# Patient Record
Sex: Female | Born: 1965 | Race: White | Hispanic: No | Marital: Married | Smoking: Never smoker
Health system: Southern US, Community
[De-identification: ages and names within clinical notes are randomized; demographics above are authoritative.]

## PROBLEM LIST (undated history)

## (undated) DIAGNOSIS — K219 Gastro-esophageal reflux disease without esophagitis: Secondary | ICD-10-CM

## (undated) DIAGNOSIS — E042 Nontoxic multinodular goiter: Secondary | ICD-10-CM

## (undated) DIAGNOSIS — T7840XA Allergy, unspecified, initial encounter: Secondary | ICD-10-CM

## (undated) DIAGNOSIS — I10 Essential (primary) hypertension: Secondary | ICD-10-CM

## (undated) DIAGNOSIS — N289 Disorder of kidney and ureter, unspecified: Secondary | ICD-10-CM

## (undated) DIAGNOSIS — M19049 Primary osteoarthritis, unspecified hand: Secondary | ICD-10-CM

## (undated) DIAGNOSIS — D6851 Activated protein C resistance: Secondary | ICD-10-CM

## (undated) HISTORY — PX: ARTHROSCOPIC HAGLUNDS REPAIR: SHX5187

## (undated) HISTORY — DX: Primary osteoarthritis, unspecified hand: M19.049

## (undated) HISTORY — DX: Gastro-esophageal reflux disease without esophagitis: K21.9

## (undated) HISTORY — DX: Activated protein C resistance: D68.51

## (undated) HISTORY — DX: Essential (primary) hypertension: I10

## (undated) HISTORY — DX: Allergy, unspecified, initial encounter: T78.40XA

## (undated) HISTORY — DX: Nontoxic multinodular goiter: E04.2

## (undated) HISTORY — PX: FOOT SURGERY: SHX648

---
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
## 2015-01-28 LAB — HM COLONOSCOPY

## 2016-05-10 DIAGNOSIS — I10 Essential (primary) hypertension: Secondary | ICD-10-CM | POA: Insufficient documentation

## 2016-05-10 DIAGNOSIS — H5213 Myopia, bilateral: Secondary | ICD-10-CM | POA: Insufficient documentation

## 2016-05-10 DIAGNOSIS — M1990 Unspecified osteoarthritis, unspecified site: Secondary | ICD-10-CM | POA: Insufficient documentation

## 2017-10-17 ENCOUNTER — Encounter (INDEPENDENT_AMBULATORY_CARE_PROVIDER_SITE_OTHER): Payer: Self-pay

## 2017-10-17 ENCOUNTER — Encounter: Payer: Self-pay | Admitting: Physician Assistant

## 2017-10-17 ENCOUNTER — Ambulatory Visit: Payer: 59 | Admitting: Physician Assistant

## 2017-10-17 VITALS — BP 119/83 | HR 96 | Ht 67.0 in | Wt 251.0 lb

## 2017-10-17 DIAGNOSIS — E042 Nontoxic multinodular goiter: Secondary | ICD-10-CM | POA: Diagnosis not present

## 2017-10-17 DIAGNOSIS — Z130 Encounter for screening for diseases of the blood and blood-forming organs and certain disorders involving the immune mechanism: Secondary | ICD-10-CM

## 2017-10-17 DIAGNOSIS — Z1231 Encounter for screening mammogram for malignant neoplasm of breast: Secondary | ICD-10-CM

## 2017-10-17 DIAGNOSIS — Z832 Family history of diseases of the blood and blood-forming organs and certain disorders involving the immune mechanism: Secondary | ICD-10-CM | POA: Diagnosis not present

## 2017-10-17 DIAGNOSIS — M19042 Primary osteoarthritis, left hand: Secondary | ICD-10-CM | POA: Diagnosis not present

## 2017-10-17 DIAGNOSIS — E041 Nontoxic single thyroid nodule: Secondary | ICD-10-CM | POA: Insufficient documentation

## 2017-10-17 DIAGNOSIS — Z131 Encounter for screening for diabetes mellitus: Secondary | ICD-10-CM | POA: Diagnosis not present

## 2017-10-17 DIAGNOSIS — E6609 Other obesity due to excess calories: Secondary | ICD-10-CM

## 2017-10-17 DIAGNOSIS — Z808 Family history of malignant neoplasm of other organs or systems: Secondary | ICD-10-CM | POA: Diagnosis not present

## 2017-10-17 DIAGNOSIS — M19049 Primary osteoarthritis, unspecified hand: Secondary | ICD-10-CM | POA: Insufficient documentation

## 2017-10-17 DIAGNOSIS — Z13220 Encounter for screening for lipoid disorders: Secondary | ICD-10-CM | POA: Diagnosis not present

## 2017-10-17 DIAGNOSIS — M19041 Primary osteoarthritis, right hand: Secondary | ICD-10-CM

## 2017-10-17 DIAGNOSIS — Z7689 Persons encountering health services in other specified circumstances: Secondary | ICD-10-CM

## 2017-10-17 MED ORDER — OLMESARTAN MEDOXOMIL 40 MG PO TABS: 40.0000 mg | ORAL_TABLET | Freq: Every day | ORAL | 1 refills | Status: DC

## 2017-10-17 NOTE — Progress Notes (Signed)
HPI:                                                                Areal Cochrane is a 52 y.o. female who presents to [name]: Primary Care Sports Medicine today to establish care  Current concerns:   HTN: taking Benicar 40 mg daily. Compliant with medications. Denies vision change, headache, chest pain with exertion, orthopnea, lightheadedness, syncope and edema. Risk factors include: obesity, family hx  Thyroid nodules: per patient, 3 nodules that are followed by [facility] with yearly ultrasound.   Depression screen [facility] 2/9 10/17/2017  Decreased Interest 0  Down, Depressed, Hopeless 0  PHQ - 2 Score 0    No flowsheet data found.    Past Medical History:  Diagnosis Date  . Heterozygous factor V Leiden mutation (HCC) 10/21/2017  . Hypertension   . Multiple thyroid nodules   . Osteoarthritis of hand    Past Surgical History:  Procedure Laterality Date  . ARTHROSCOPIC HAGLUNDS REPAIR Right   . FOOT SURGERY Right    Social History   Tobacco Use  . Smoking status: Never Smoker  . Smokeless tobacco: Never Used  Substance Use Topics  . Alcohol use: Yes   family history includes Cancer in her brother and mother; Diabetes in her father and sister; Factor V Leiden deficiency in her father and sister; Heart attack in her father and mother; Hyperlipidemia in her sister; Hypertension in her brother, brother, father, sister, and sister; Thyroid cancer in her brother.    ROS: Review of Systems  Endo/Heme/Allergies:       + vasomotor flushing  All other systems reviewed and are negative.    Medications: Current Outpatient Medications  Medication Sig Dispense Refill  . Cholecalciferol (VITAMIN D PO) Take by mouth.    . cyclobenzaprine (FLEXERIL) 5 MG tablet Take 5 mg by mouth as needed for muscle spasms.    Marland Kitchen olmesartan (BENICAR) 40 MG tablet Take 1 tablet (40 mg total) by mouth daily. 90 tablet 1   No current facility-administered medications for this  visit.    Allergies  Allergen Reactions  . Codeine Shortness Of Breath       Objective:  BP 119/83   Pulse 96   Ht  (1.702 m)   Wt 251 lb (113.9 kg)   LMP 10/13/2017   BMI 39.31 kg/m  Gen:  alert, not ill-appearing, no distress, appropriate for age, obese female HEENT: head normocephalic without obvious abnormality, conjunctiva and cornea clear, wearing glasses, trachea midline, thyroid enlarged, no tenderness Pulm: Normal work of breathing, normal phonation, clear to auscultation bilaterally, no wheezes, rales or rhonchi CV: Normal rate, regular rhythm, s1 and s2 distinct, no murmurs, clicks or rubs, no carotid bruit, radial pulses 2+ symmetric Neuro: alert and oriented x 3, no tremor MSK: extremities atraumatic, normal gait and station Skin: intact, no rashes on exposed skin, no jaundice, no cyanosis Psych: well-groomed, cooperative, good eye contact, euthymic mood, affect mood-congruent, speech is articulate, and thought processes clear and goal-directed    No results found for this or any previous visit (from the past 72 hour(s)). Mm 3d Screen Breast Bilateral  Result Date: 10/25/2017 CLINICAL DATA:  Screening. EXAM: DIGITAL SCREENING BILATERAL MAMMOGRAM WITH TOMO AND CAD COMPARISON:  None. ACR Breast Density Category b: There are scattered areas of fibroglandular density. FINDINGS: There are no findings suspicious for malignancy. Images were processed with CAD. IMPRESSION: No mammographic evidence of malignancy. A result letter of this screening mammogram will be mailed directly to the patient. RECOMMENDATION: Screening mammogram in one year. (Code:[id number]-01Y) BI-RADS CATEGORY  1: Negative. Electronically Signed   By: Amie Portland M.D.   On: 10/25/2017 12:42      Assessment and Plan: 52 y.o. female with   Encounter to establish care  Breast cancer screening by mammogram - Plan: MM 3D SCREEN BREAST BILATERAL  Family history of thyroid cancer - Plan: TSH + free  T4  Screening for lipid disorders - Plan: Lipid Panel w/reflex Direct LDL  Screening for blood disease - Plan: CBC, Comprehensive metabolic panel  Screening for diabetes mellitus - Plan: Comprehensive metabolic panel  Family history of factor V Leiden mutation - Plan: Factor 5 leiden, Factor 5 assay  Primary osteoarthritis of both hands  Multiple thyroid nodules  - Personally reviewed PMH, PSH, PFH, medications, allergies, HM - Age-appropriate cancer screening: mammogram ordered, due for Pap smear - Influenza n/a - Tdap UTD per patient - PHQ2 negative  HTN BP Readings from Last 3 Encounters:  10/17/17 119/83  - BP in range - checking renal function and electrolytes today - cont Benicar 40 mg daily - baby asa for primary prevention - counseled on therapeutic lifestyle changes  Obesity, Class 1 - counseled on weight loss through decreased caloric intake and increased aerobic exercise - DASH eating plan  Patient education and anticipatory guidance given Patient agrees with treatment plan Follow-up in 1 month for CPE w/Pap or sooner as needed if symptoms worsen or fail to improve  Levonne Hubert PA-C

## 2017-10-17 NOTE — Patient Instructions (Addendum)
For your blood pressure: - Goal <130/80 - baby aspirin 81 mg daily to help prevent heart attack/stroke - monitor and log blood pressures at home - check around the same time each day in a relaxed setting - Limit salt to <2000 mg/day - Follow DASH eating plan - limit alcohol to 2 standard drinks per day for men and 1 per day for women - avoid tobacco products - weight loss: 7% of current body weight - follow-up every 6 months for your blood pressure   

## 2017-10-18 LAB — COMPREHENSIVE METABOLIC PANEL
AG Ratio: 1.5 (calc) (ref 1.0–2.5)
ALBUMIN MSPROF: 4.4 g/dL (ref 3.6–5.1)
ALT: 16 U/L (ref 6–29)
AST: 15 U/L (ref 10–35)
Alkaline phosphatase (APISO): 49 U/L (ref 33–130)
BUN: 14 mg/dL (ref 7–25)
CHLORIDE: 104 mmol/L (ref 98–110)
CO2: 24 mmol/L (ref 20–32)
CREATININE: 0.9 mg/dL (ref 0.50–1.05)
Calcium: 9.8 mg/dL (ref 8.6–10.4)
GLOBULIN: 2.9 g/dL (ref 1.9–3.7)
GLUCOSE: 97 mg/dL (ref 65–99)
POTASSIUM: 4.3 mmol/L (ref 3.5–5.3)
Sodium: 137 mmol/L (ref 135–146)
TOTAL PROTEIN: 7.3 g/dL (ref 6.1–8.1)
Total Bilirubin: 0.9 mg/dL (ref 0.2–1.2)

## 2017-10-18 LAB — LIPID PANEL W/REFLEX DIRECT LDL
Cholesterol: 192 mg/dL (ref <200)
HDL: 48 mg/dL — ABNORMAL LOW (ref >50)
LDL CHOLESTEROL (CALC): 122 mg/dL — AB
NON-HDL CHOLESTEROL (CALC): 144 mg/dL — AB (ref <130)
Total CHOL/HDL Ratio: 4 (calc) (ref <5.0)
Triglycerides: 108 mg/dL (ref <150)

## 2017-10-18 LAB — CBC
HCT: 43.5 % (ref 35.0–45.0)
Hemoglobin: 15.1 g/dL (ref 11.7–15.5)
MCH: 31.9 pg (ref 27.0–33.0)
MCHC: 34.7 g/dL (ref 32.0–36.0)
MCV: 92 fL (ref 80.0–100.0)
MPV: 9.9 fL (ref 7.5–12.5)
Platelets: 323 10*3/uL (ref 140–400)
RBC: 4.73 10*6/uL (ref 3.80–5.10)
RDW: 12.1 % (ref 11.0–15.0)
WBC: 8.3 10*3/uL (ref 3.8–10.8)

## 2017-10-18 LAB — TSH+FREE T4: TSH W/REFLEX TO FT4: 0.59 mIU/L

## 2017-10-20 LAB — FACTOR 5 LEIDEN

## 2017-10-20 LAB — FACTOR 5 ASSAY: FACTOR V ACTIVITY: 99 % (ref 65–150)

## 2017-10-21 ENCOUNTER — Encounter: Payer: Self-pay | Admitting: Physician Assistant

## 2017-10-21 DIAGNOSIS — D6851 Activated protein C resistance: Secondary | ICD-10-CM | POA: Insufficient documentation

## 2017-10-21 HISTORY — DX: Activated protein C resistance: D68.51

## 2017-10-21 NOTE — Progress Notes (Signed)
Good morning Desiree Valencia, Accardi do carry the factor V Leiden variant and this increases your overall risk of a blood clot. However, you only have 1 copy of the gene mutation (heterozygous), so you are at decreased risk compared to someone who has both copies (homozygous).  Your other labs look great! - normal kidney function - cholesterol in a healthy range - normal blood counts - no evidence of diabetes - no evidence of hypo- or hyper-thyroidism  Best, Vinetta Bergamo

## 2017-10-25 ENCOUNTER — Ambulatory Visit (INDEPENDENT_AMBULATORY_CARE_PROVIDER_SITE_OTHER): Payer: 59

## 2017-10-25 DIAGNOSIS — Z1231 Encounter for screening mammogram for malignant neoplasm of breast: Secondary | ICD-10-CM | POA: Diagnosis not present

## 2017-10-28 ENCOUNTER — Encounter: Payer: Self-pay | Admitting: Physician Assistant

## 2017-10-28 DIAGNOSIS — E66812 Obesity, class 2: Secondary | ICD-10-CM | POA: Insufficient documentation

## 2017-10-28 DIAGNOSIS — E6609 Other obesity due to excess calories: Secondary | ICD-10-CM | POA: Insufficient documentation

## 2017-11-18 ENCOUNTER — Other Ambulatory Visit (HOSPITAL_COMMUNITY)
Admission: RE | Admit: 2017-11-18 | Discharge: 2017-11-18 | Disposition: A | Discharge status: Home / Self Care | Payer: 59 | Source: Ambulatory Visit | Attending: Physician Assistant | Admitting: Physician Assistant

## 2017-11-18 ENCOUNTER — Encounter: Payer: Self-pay | Admitting: Physician Assistant

## 2017-11-18 ENCOUNTER — Ambulatory Visit (INDEPENDENT_AMBULATORY_CARE_PROVIDER_SITE_OTHER): Payer: 59 | Admitting: Physician Assistant

## 2017-11-18 VITALS — BP 153/89 | HR 100 | Wt 250.0 lb

## 2017-11-18 DIAGNOSIS — M19042 Primary osteoarthritis, left hand: Secondary | ICD-10-CM | POA: Diagnosis not present

## 2017-11-18 DIAGNOSIS — L304 Erythema intertrigo: Secondary | ICD-10-CM | POA: Diagnosis not present

## 2017-11-18 DIAGNOSIS — Z124 Encounter for screening for malignant neoplasm of cervix: Secondary | ICD-10-CM

## 2017-11-18 DIAGNOSIS — E6609 Other obesity due to excess calories: Secondary | ICD-10-CM | POA: Diagnosis not present

## 2017-11-18 DIAGNOSIS — R030 Elevated blood-pressure reading, without diagnosis of hypertension: Secondary | ICD-10-CM

## 2017-11-18 DIAGNOSIS — M19041 Primary osteoarthritis, right hand: Secondary | ICD-10-CM | POA: Diagnosis not present

## 2017-11-18 MED ORDER — MELOXICAM 15 MG PO TABS: 15.0000 mg | ORAL_TABLET | Freq: Every day | ORAL | 3 refills | Status: DC | PRN

## 2017-11-18 MED ORDER — NYSTATIN 100000 UNIT/GM EX CREA: 1.0000 "application " | TOPICAL_CREAM | Freq: Two times a day (BID) | CUTANEOUS | 0 refills | Status: DC

## 2017-11-18 NOTE — Progress Notes (Signed)
HPI:                                                                Desiree Valencia is a 52 y.o. female who [facility] Desiree Valencia: Primary Care Sports Medicine today for Pap smear only  Current Concerns include  Requesting refill of Meloxicam for hand OA   GYN/Sexual Health  Obstetrics: G0P0  Menstrual status: having periods, perimenopausal  LMP: 10/23/17  Menses: shorter, irregular  Last pap smear: unknown  History of abnormal pap smears: no  Sexually active: yes  Current contraception:  History of STI: no  Depression screen PHQ 2/9 10/17/2017  Decreased Interest 0  Down, Depressed, Hopeless 0  PHQ - 2 Score 0    Health Maintenance Health Maintenance  Topic Date Due  . HIV Screening  08/13/1979  . PAP SMEAR  08/13/1994  . INFLUENZA VACCINE  01/02/2018  . MAMMOGRAM  10/26/2019  . COLONOSCOPY  01/28/2020  . TETANUS/TDAP  10/17/2021    Past Medical History:  Diagnosis Date  . Heterozygous factor V Leiden mutation (HCC) 10/21/2017  . Hypertension   . Multiple thyroid nodules   . Osteoarthritis of hand    Past Surgical History:  Procedure Laterality Date  . ARTHROSCOPIC HAGLUNDS REPAIR Right   . FOOT SURGERY Right    Social History   Tobacco Use  . Smoking status: Never Smoker  . Smokeless tobacco: Never Used  Substance Use Topics  . Alcohol use: Yes   family history includes Cancer in her brother and mother; Diabetes in her father and sister; Factor V Leiden deficiency in her father and sister; Heart attack in her father and mother; Hyperlipidemia in her sister; Hypertension in her brother, brother, father, sister, and sister; Thyroid cancer in her brother.  ROS: negative except as noted in the HPI  Medications: Current Outpatient Medications  Medication Sig Dispense Refill  . Cholecalciferol (VITAMIN D PO) Take by mouth.    . cyclobenzaprine (FLEXERIL) 5 MG tablet Take 5 mg by mouth as needed for muscle spasms.    Marland Kitchen.  olmesartan (BENICAR) 40 MG tablet Take 1 tablet (40 mg total) by mouth daily. 90 tablet 1  . omeprazole (PRILOSEC) 20 MG capsule Take 20 mg by mouth daily.    . meloxicam (MOBIC) 15 MG tablet Take 1 tablet (15 mg total) by mouth daily as needed for pain. 30 tablet 3  . nystatin cream (MYCOSTATIN) Apply 1 application topically 2 (two) times daily. 30 g 0   No current facility-administered medications for this visit.    Allergies  Allergen Reactions  . Codeine Shortness Of Breath       Objective:  BP (!) 153/89   Pulse 100   Wt 250 lb (113.4 kg)   LMP 10/23/2017 (Exact Date)   BMI 39.16 kg/m  Gen:  alert, not ill-appearing, no distress, appropriate for age, obese female HEENT: head normocephalic without obvious abnormality, conjunctiva and cornea clear, wearing glasses, trachea midline Pulm: Normal work of breathing, normal phonation, clear to auscultation bilaterally, no wheezes, rales or rhonchi CV: Normal rate, regular rhythm, s1 and s2 distinct, no murmurs, clicks or rubs  GU: inguinal folds with hyperpigmented, scaly plaques, vulva without rashes or lesions, normal introitus and urethral meatus, vaginal mucosa without  erythema, normal discharge, cervix non-friable without lesions Neuro: alert and oriented x 3, no tremor MSK: extremities atraumatic, normal gait and station Skin: intact, no rashes on exposed skin, no jaundice, no cyanosis Psych: well-groomed, cooperative, good eye contact, euthymic mood, affect mood-congruent, speech is articulate, and thought processes clear and goal-directed    A chaperone was used for the GU portion of the exam, Desiree Valencia, CMA.    No results found for this or any previous visit (from the past 72 hour(s)). No results found.    Assessment and Plan: 52 y.o. female with   Encounter for Pap smear of cervix with HPV DNA cotesting - Plan: Cytology - PAP  Intertrigo - Plan: nystatin cream (MYCOSTATIN)  Primary osteoarthritis of both  hands - Plan: meloxicam (MOBIC) 15 MG tablet  Class 2 obesity due to excess calories without serious comorbidity in adult, unspecified BMI  Elevated blood pressure reading  - Personally reviewed PMH, PSH, PFH, medications, allergies, HM - Age-appropriate cancer screening: Mammogram UTD, Pap collected today, Colonoscopy UTD - Influenza n/a - Tdap UTD per patient - VIS given for Shingrix, still on backorder - PHQ2 negative - Fasting labs performed 4 weeks ago and unremarkable, low HDL   Elevated BP BP Readings from Last 3 Encounters:  11/18/17 (!) 153/89  10/17/17 119/83  - BP out of range on 2 checks, patient reports she is nervous and has white coat syndrome - counseled on therapeutic lifestyle changes - patient to monitor and log BP's at home - follow-up nurse BP check in 4 weeks  Obesity, Class 2 - counseled on weight loss through decreased caloric intake and increased aerobic exercise  Patient education and anticipatory guidance given Patient agrees with treatment plan Follow-up based on Pap results or sooner as needed  Levonne Hubert PA-C

## 2017-11-18 NOTE — Patient Instructions (Signed)
Preventive Care 40-64 Years, Female Preventive care refers to lifestyle choices and visits with your health care provider that can promote health and wellness. What does preventive care include?  A yearly physical exam. This is also called an annual well check.  Dental exams once or twice a year.  Routine eye exams. Ask your health care provider how often you should have your eyes checked.  Personal lifestyle choices, including: ? Daily care of your teeth and gums. ? Regular physical activity. ? Eating a healthy diet. ? Avoiding tobacco and drug use. ? Limiting alcohol use. ? Practicing safe sex. ? Taking low-dose aspirin daily starting at age 58. ? Taking vitamin and mineral supplements as recommended by your health care provider. What happens during an annual well check? The services and screenings done by your health care provider during your annual well check will depend on your age, overall health, lifestyle risk factors, and family history of disease. Counseling Your health care provider may ask you questions about your:  Alcohol use.  Tobacco use.  Drug use.  Emotional well-being.  Home and relationship well-being.  Sexual activity.  Eating habits.  Work and work Statistician.  Method of birth control.  Menstrual cycle.  Pregnancy history.  Screening You may have the following tests or measurements:  Height, weight, and BMI.  Blood pressure.  Lipid and cholesterol levels. These may be checked every 5 years, or more frequently if you are over 81 years old.  Skin check.  Lung cancer screening. You may have this screening every year starting at age 78 if you have a 30-pack-year history of smoking and currently smoke or have quit within the past 15 years.  Fecal occult blood test (FOBT) of the stool. You may have this test every year starting at age 65.  Flexible sigmoidoscopy or colonoscopy. You may have a sigmoidoscopy every 5 years or a colonoscopy  every 10 years starting at age 30.  Hepatitis C blood test.  Hepatitis B blood test.  Sexually transmitted disease (STD) testing.  Diabetes screening. This is done by checking your blood sugar (glucose) after you have not eaten for a while (fasting). You may have this done every 1-3 years.  Mammogram. This may be done every 1-2 years. Talk to your health care provider about when you should start having regular mammograms. This may depend on whether you have a family history of breast cancer.  BRCA-related cancer screening. This may be done if you have a family history of breast, ovarian, tubal, or peritoneal cancers.  Pelvic exam and Pap test. This may be done every 3 years starting at age 80. Starting at age 36, this may be done every 5 years if you have a Pap test in combination with an HPV test.  Bone density scan. This is done to screen for osteoporosis. You may have this scan if you are at high risk for osteoporosis.  Discuss your test results, treatment options, and if necessary, the need for more tests with your health care provider. Vaccines Your health care provider may recommend certain vaccines, such as:  Influenza vaccine. This is recommended every year.  Tetanus, diphtheria, and acellular pertussis (Tdap, Td) vaccine. You may need a Td booster every 10 years.  Varicella vaccine. You may need this if you have not been vaccinated.  Zoster vaccine. You may need this after age 5.  Measles, mumps, and rubella (MMR) vaccine. You may need at least one dose of MMR if you were born in  1958 or later. You may also need a second dose.  Pneumococcal 13-valent conjugate (PCV13) vaccine. You may need this if you have certain conditions and were not previously vaccinated.  Pneumococcal polysaccharide (PPSV23) vaccine. You may need one or two doses if you smoke cigarettes or if you have certain conditions.  Meningococcal vaccine. You may need this if you have certain  conditions.  Hepatitis A vaccine. You may need this if you have certain conditions or if you travel or work in places where you may be exposed to hepatitis A.  Hepatitis B vaccine. You may need this if you have certain conditions or if you travel or work in places where you may be exposed to hepatitis B.  Haemophilus influenzae type b (Hib) vaccine. You may need this if you have certain conditions.  Talk to your health care provider about which screenings and vaccines you need and how often you need them. This information is not intended to replace advice given to you by your health care provider. Make sure you discuss any questions you have with your health care provider. Document Released: 06/17/2015 Document Revised: 02/08/2016 Document Reviewed: 03/22/2015 Elsevier Interactive Patient Education  2018 [place] is skin irritation or inflammation (dermatitis) that occurs when folds of skin rub together. The irritation can cause a rash and make skin raw and itchy. This condition most commonly occurs in the skin folds of these areas:  Toes.  Armpits.  Groin.  Belly.  Breasts.  Buttocks.  Intertrigo is not passed from person to person (is not contagious). What are the causes? This condition is caused by heat, moisture, friction, and lack of air circulation. The condition can be made worse by:  Sweat.  Bacteria or a fungus, such as yeast.  What increases the risk? This condition is more likely to occur if you have moisture in your skin folds. It is also more likely to develop in people who:  Have diabetes.  Are overweight.  Are on bed rest.  Live in a warm and moist climate.  Wear splints, braces, or other medical devices.  Are not able to control their bowels or bladder (have incontinence).  What are the signs or symptoms? Symptoms of this condition include:  A pink or red skin rash.  Brown patches on the skin.  Raw or scaly  skin.  Itchiness.  A burning feeling.  Bleeding.  Leaking fluid.  A bad smell.  How is this diagnosed? This condition is diagnosed with a medical history and physical exam. You may also have a skin swab to test for bacteria or a fungus, such as yeast. How is this treated? Treatment may include:  Cleaning and drying your skin.  An oral antibiotic medicine or antibiotic skin cream for a bacterial infection.  Antifungal cream or pills for an infection that was caused by a fungus, such as yeast.  Steroid ointment to relieve itchiness and irritation.  Follow these instructions at home:  Keep the affected area clean and dry.  Do not scratch your skin.  Stay in a cool environment as much as possible. Use an air conditioner or fan, if available.  Apply over-the-counter and prescription medicines only as told by your health care provider.  If you were prescribed an antibiotic medicine, use it as told by your health care provider. Do not stop using the antibiotic even if your condition improves.  Keep all follow-up visits as told by your health care provider. This is important. How  is this prevented?  Maintain a healthy weight.  Take care of your feet, especially if you have diabetes. Foot care includes: ? Wearing shoes that fit well. ? Keeping your feet dry. ? Wearing clean, breathable socks.  Protect the skin around your groin and buttocks, especially if you have incontinence. Skin protection includes: ? Following a regular cleaning routine. ? Using moisturizers and skin protectants. ? Changing protection pads frequently.  Do not wear tight clothes. Wear clothes that are loose and absorbent. Wear clothes that are made of cotton.  Wear a bra that gives good support, if needed.  Shower and dry yourself thoroughly after activity. Use a hair dryer on a cool setting to dry between skin folds, especially after you bathe.  If you have diabetes, keep your blood sugar under  control. Contact a health care provider if:  Your symptoms do not improve with treatment.  Your symptoms get worse or they spread.  You notice increased redness and warmth.  You have a fever. This information is not intended to replace advice given to you by your health care provider. Make sure you discuss any questions you have with your health care provider. Document Released: 05/21/2005 Document Revised: 10/27/2015 Document Reviewed: 11/22/2014 Elsevier Interactive Patient Education  2018 [employer].

## 2017-11-19 ENCOUNTER — Encounter: Payer: Self-pay | Admitting: Physician Assistant

## 2017-11-19 LAB — CYTOLOGY - PAP
Adequacy: ABSENT
Diagnosis: NEGATIVE
HPV (WINDOPATH): NOT DETECTED

## 2017-11-19 NOTE — Progress Notes (Signed)
Good morning Kitty,  Your Pap smear was normal and HPV testing was negative. Recommend repeat screening in 5 years.  Best, Vinetta Bergamoharley

## 2017-12-15 ENCOUNTER — Encounter: Payer: Self-pay | Admitting: Physician Assistant

## 2017-12-19 ENCOUNTER — Ambulatory Visit (INDEPENDENT_AMBULATORY_CARE_PROVIDER_SITE_OTHER): Payer: 59 | Admitting: Physician Assistant

## 2017-12-19 VITALS — BP 139/72 | HR 101 | Wt 251.0 lb

## 2017-12-19 DIAGNOSIS — I10 Essential (primary) hypertension: Secondary | ICD-10-CM | POA: Diagnosis not present

## 2017-12-19 NOTE — Progress Notes (Signed)
   Subjective:    Patient ID: Desiree Valencia, female    DOB: 10/16/1965, 52 y.o.   MRN: [id number]  HPI  Desiree Valencia is here for a blood pressure check. Currently taking Olmesartan 40 mg daily. Compliant with medications. Denies chest pains, shortness of breath or dizziness.  Home BP range 111-127/67-80  Review of Systems     Objective:   Physical Exam  Vitals:   12/19/17 0841  BP: 139/72  Pulse: (!) 101  SpO2: 100%         Assessment & Plan:  Hypertension - SBP out of range in office, but home readings are in range. Patient advised to follow up as directed and continue medications as directed.

## 2018-04-13 ENCOUNTER — Other Ambulatory Visit: Payer: Self-pay | Admitting: Physician Assistant

## 2018-04-20 ENCOUNTER — Encounter: Payer: Self-pay | Admitting: Physician Assistant

## 2018-06-06 ENCOUNTER — Encounter: Payer: Self-pay | Admitting: Physician Assistant

## 2018-06-06 NOTE — Telephone Encounter (Signed)
Patient advised.

## 2018-06-09 ENCOUNTER — Ambulatory Visit: Payer: 59 | Admitting: Physician Assistant

## 2018-06-09 ENCOUNTER — Ambulatory Visit (INDEPENDENT_AMBULATORY_CARE_PROVIDER_SITE_OTHER): Payer: 59

## 2018-06-09 ENCOUNTER — Encounter: Payer: Self-pay | Admitting: Physician Assistant

## 2018-06-09 VITALS — BP 133/82 | HR 103 | Wt 255.0 lb

## 2018-06-09 DIAGNOSIS — R000 Tachycardia, unspecified: Secondary | ICD-10-CM

## 2018-06-09 DIAGNOSIS — M25531 Pain in right wrist: Secondary | ICD-10-CM | POA: Diagnosis not present

## 2018-06-09 DIAGNOSIS — M79642 Pain in left hand: Secondary | ICD-10-CM

## 2018-06-09 DIAGNOSIS — M79641 Pain in right hand: Secondary | ICD-10-CM | POA: Diagnosis not present

## 2018-06-09 DIAGNOSIS — M67431 Ganglion, right wrist: Secondary | ICD-10-CM | POA: Diagnosis not present

## 2018-06-09 MED ORDER — ACETAMINOPHEN ER 650 MG PO TBCR: 650.0000 mg | EXTENDED_RELEASE_TABLET | Freq: Three times a day (TID) | ORAL | 3 refills | Status: AC | PRN

## 2018-06-09 NOTE — Progress Notes (Signed)
HPI:                                                                Desiree Valencia is a 53 y.o. female who presents to [facility] Desiree Valencia: Primary Care Sports Medicine today for bilateral hand pain  For the last two weeks she has had generalized joint pain described as "achey" in both of her hands. She also noted a right wrist lump and left palm lump developed approx 2 weeks ago.  No history of trauma/injury. Reports swelling and pain has improved with splinting and Meloxicam.    Past Medical History:  Diagnosis Date  . Heterozygous factor V Leiden mutation (HCC) 10/21/2017  . Hypertension   . Multiple thyroid nodules   . Osteoarthritis of hand    Past Surgical History:  Procedure Laterality Date  . ARTHROSCOPIC HAGLUNDS REPAIR Right   . FOOT SURGERY Right    Social History   Tobacco Use  . Smoking status: Never Smoker  . Smokeless tobacco: Never Used  Substance Use Topics  . Alcohol use: Yes   family history includes Cancer in her brother and mother; Diabetes in her father and sister; Factor V Leiden deficiency in her father and sister; Heart attack in her father and mother; Hyperlipidemia in her sister; Hypertension in her brother, brother, father, sister, and sister; Thyroid cancer in her brother.    ROS: negative except as noted in the HPI  Medications: Current Outpatient Medications  Medication Sig Dispense Refill  . acetaminophen (TYLENOL) 650 MG CR tablet Take 1-2 tablets (650-1,300 mg total) by mouth every 8 (eight) hours as needed for pain. 90 tablet 3  . Cholecalciferol (VITAMIN D PO) Take by mouth.    . cyclobenzaprine (FLEXERIL) 5 MG tablet Take 5 mg by mouth as needed for muscle spasms.    . meloxicam (MOBIC) 15 MG tablet Take 1 tablet (15 mg total) by mouth daily as needed for pain. 30 tablet 3  . olmesartan (BENICAR) 40 MG tablet Take 1 tablet (40 mg total) by mouth daily. 90 tablet 1  . omeprazole (PRILOSEC) 20 MG capsule Take 20 mg by mouth  daily.     No current facility-administered medications for this visit.    Allergies  Allergen Reactions  . Codeine Shortness Of Breath       Objective:  BP 133/82   Pulse (!) 103   Wt 255 lb (115.7 kg)   SpO2 97%   BMI 39.94 kg/m  Gen:  alert, not ill-appearing, no distress, appropriate for age HEENT: head normocephalic without obvious abnormality, conjunctiva and cornea clear, trachea midline Pulm: Normal work of breathing, normal phonation  Neuro: alert and oriented x 3, no tremor MSK: extremities atraumatic, normal gait and station Right wrist/hand: atraumatic, ganglion cyst of dorsum of right wrist, strength intact Left wrist/hand: atraumatic, soft, cystic nodule of the left palm at the 4th metacarpal, ROM and strenght intact, able to extend left wrist and place palm flat on exam table   No results found for this or any previous visit (from the past 72 hour(s)). No results found.    Assessment and Plan: 53 y.o. female with   .Desiree Valencia was seen today for hand problem.  Diagnoses and all orders for this visit:  Ganglion cyst  of dorsum of right wrist -     DG Wrist Complete Right  Bilateral hand pain -     DG Hand Complete Left -     DG Hand Complete Right -     acetaminophen (TYLENOL) 650 MG CR tablet; Take 1-2 tablets (650-1,300 mg total) by mouth every 8 (eight) hours as needed for pain.  Tachycardia with heart rate 100-120 beats per minute -     TSH + free T4 -     CBC -     COMPLETE METABOLIC PANEL WITH GFR  Continue conservative treatment of hand pain/ganglion with daily splinting and anti-inflammatory. Declined refill of Meloxicam. May also alternate with Acetaminophen. X-rays pending. Follow-up with Sports Medicine in 1 month  Noted mild tachycardia in office today. Patient is taking Olmesartan for HTN and has a history of euthyroid thyroid nodules. Checking CBC, CMP, TSH today. Return for ECG and tachycardia follow-up in 1 week   Patient education  and anticipatory guidance given Patient agrees with treatment plan  Desiree Valencia E. Desiree Markie PA-C

## 2018-06-09 NOTE — Patient Instructions (Addendum)
Wear the splint as much as tolerated Continue Meloxicam daily with food for the next 2 weeks Can also take Tylenol arthritis pain 1-2 tablets every 8 hours as needed for pain Follow-up with Sports Medicine in 1 month if symptoms persist Return sooner for worsening symptoms including weakness    What You Need to Know About Osteoarthritis Osteoarthritis is a type of arthritis that affects tissue that covers the ends of bones in joints (cartilage). Cartilage acts as a cushion between the bones and helps them move smoothly. Osteoarthritis results when cartilage in the joints gets worn down. Osteoarthritis is sometimes called "wear and tear" arthritis. Osteoarthritis can affect any joint and can make movement painful. Hips, knees, fingers, and toes are some of the joints that are most often affected by osteoarthritis. You may be more likely to develop osteoarthritis if:  You are middle-aged or older.  You are obese.  You have injured a joint or had surgery on a joint.  You have a family history of osteoarthritis. How can osteoarthritis affect me? Osteoarthritis can cause:  Pain and swelling in your joint.  Difficulty moving your joint.  A grating or scraping feeling inside the joint when you move it.  Popping or creaking sounds when you move. This condition can make it harder to do things that you need or want to do each day. Osteoarthritis in a major joint, such as your knee or hip, can make it painful to walk or exercise. If you have osteoarthritis in your hands, you might not be able to grip items, twist your hand, or control small movements of your hands and fingers (fine motor skills). Over time, osteoarthritis could cause you to be less physically active. Being less active increases your risk for other long-term (chronic) health problems, such as diabetes and heart disease. What lifestyle changes can be made? You can lessen the impact that osteoarthritis has on your daily life  by:  Switching to low-impact activities that do not put repeated pressure on your joints. For example, if you usually run or jog for exercise, try swimming or riding a bike instead.  Staying active. Build up to at least 150 minutes of physical activity each week to keep your joints healthy and keep your body strong.  Losing weight. If you are overweight or obese, losing weight can take pressure off of your joints. If you need help with weight loss, talk with your health care provider or a diet and nutrition specialist (dietitian). What other changes can be made? You can also lessen the effect of osteoarthritis by:  Using assistive devices. Sometimes a brace, wrap, splint, specialized glove, or cane can help. Talk with your health care provider or physical therapist about when and how to use these.  Working with a physical therapist who can help you find ways to do your daily activities without harming your joints. A physical therapist can also teach you exercises and stretches to strengthen the muscles that support your joints.  Treating pain and inflammation. Take over-the-counter and prescription medicines for pain and inflammation only as told by your health care provider. If directed, you may put ice on the affected joint: ? If you have a removable assistive device, remove it as told by your health care provider. ? Put ice in a plastic bag. ? Place a towel between your skin and the bag. ? Leave the ice on for 20 minutes, 2-3 times a day. If other measures do not work, you may need joint surgery, such  as joint replacement. What can happen if changes are not made? Osteoarthritis is a condition that gets worse over time (a progressive condition). If you do not take steps to strengthen your body and to slow down the progress of the disease, your condition may get worse more quickly. Your joints may stiffen and become swollen, which will make them painful and hard to move. Where to find more  information You can learn more about osteoarthritis from:  The Arthritis Foundation: www.http://www.ingram.com/arthritis.org/about-arthritis/types/osteoarthritis  [employer] Arthritis and Musculoskeletal and Skin Diseases: www.niams.https://www.ruiz-smith.com/nih.gov/health_info/osteoarthritis/osteoarthritis_ff.asp Contact a health care provider if:  You cannot do your normal activities comfortably.  Your joint does not function at all.  Your pain is interfering with your sleep.  You are gaining weight.  Your joint appears to be changing in shape, instead of just being swollen and sore. Summary  Osteoarthritis is a painful joint disease that gets worse over time.  This condition can lead to other long-term (chronic) health problems.  There are changes that you can make to slow down the progression of the disease. This information is not intended to replace advice given to you by your health care provider. Make sure you discuss any questions you have with your health care provider. Document Released: 01/10/2016 Document Revised: 01/12/2016 Document Reviewed: 01/10/2016 Elsevier Interactive Patient Education  2019 [employer]

## 2018-06-10 ENCOUNTER — Encounter: Payer: Self-pay | Admitting: Physician Assistant

## 2018-06-10 LAB — COMPLETE METABOLIC PANEL WITH GFR
AG RATIO: 1.6 (calc) (ref 1.0–2.5)
ALT: 18 U/L (ref 6–29)
AST: 15 U/L (ref 10–35)
Albumin: 4.4 g/dL (ref 3.6–5.1)
Alkaline phosphatase (APISO): 59 U/L (ref 33–130)
BUN: 12 mg/dL (ref 7–25)
CO2: 25 mmol/L (ref 20–32)
Calcium: 10 mg/dL (ref 8.6–10.4)
Chloride: 103 mmol/L (ref 98–110)
Creat: 0.86 mg/dL (ref 0.50–1.05)
GFR, Est African American: 89 mL/min/{1.73_m2} (ref >=60)
GFR, Est Non African American: 77 mL/min/{1.73_m2} (ref >=60)
Globulin: 2.7 g/dL (calc) (ref 1.9–3.7)
Glucose, Bld: 93 mg/dL (ref 65–99)
Potassium: 4.4 mmol/L (ref 3.5–5.3)
Sodium: 137 mmol/L (ref 135–146)
Total Bilirubin: 0.5 mg/dL (ref 0.2–1.2)
Total Protein: 7.1 g/dL (ref 6.1–8.1)

## 2018-06-10 LAB — CBC
HEMATOCRIT: 43.2 % (ref 35.0–45.0)
Hemoglobin: 15 g/dL (ref 11.7–15.5)
MCH: 31.6 pg (ref 27.0–33.0)
MCHC: 34.7 g/dL (ref 32.0–36.0)
MCV: 91.1 fL (ref 80.0–100.0)
MPV: 9.9 fL (ref 7.5–12.5)
Platelets: 328 10*3/uL (ref 140–400)
RBC: 4.74 10*6/uL (ref 3.80–5.10)
RDW: 12.3 % (ref 11.0–15.0)
WBC: 9.6 10*3/uL (ref 3.8–10.8)

## 2018-06-10 LAB — TSH+FREE T4: TSH W/REFLEX TO FT4: 1.23 mIU/L

## 2018-06-16 ENCOUNTER — Encounter: Payer: Self-pay | Admitting: Physician Assistant

## 2018-06-16 DIAGNOSIS — M67431 Ganglion, right wrist: Secondary | ICD-10-CM | POA: Insufficient documentation

## 2018-06-16 DIAGNOSIS — M79641 Pain in right hand: Secondary | ICD-10-CM | POA: Insufficient documentation

## 2018-06-16 DIAGNOSIS — M79642 Pain in left hand: Secondary | ICD-10-CM

## 2018-06-16 DIAGNOSIS — R000 Tachycardia, unspecified: Secondary | ICD-10-CM | POA: Insufficient documentation

## 2018-06-17 ENCOUNTER — Ambulatory Visit (INDEPENDENT_AMBULATORY_CARE_PROVIDER_SITE_OTHER): Payer: 59 | Admitting: Physician Assistant

## 2018-06-17 VITALS — BP 139/73 | HR 102 | Temp 98.5°F | Wt 254.0 lb

## 2018-06-17 DIAGNOSIS — R000 Tachycardia, unspecified: Secondary | ICD-10-CM

## 2018-06-17 MED ORDER — METOPROLOL SUCCINATE ER 25 MG PO TB24: 25.0000 mg | ORAL_TABLET | Freq: Every day | ORAL | 0 refills | Status: DC

## 2018-06-17 NOTE — Progress Notes (Signed)
HPI:                                                                Desiree Valencia is a 53 y.o. female who presents to [name]: Primary Care Sports Medicine today for ECG   Patient reports for the last 3 years she has noticed elevated heart rate and palpitations that she describes as "feeling like my insides are racing." Symptoms occur both at rest and with exertion. She denies any exertional chest pain, change in exercise tolerance, dyspnea or peripheral edema. She regularly walks 1-2 miles daily.   Past Medical History:  Diagnosis Date  . Heterozygous factor V Leiden mutation (HCC) 10/21/2017  . Hypertension   . Multiple thyroid nodules   . Osteoarthritis of hand    Past Surgical History:  Procedure Laterality Date  . ARTHROSCOPIC HAGLUNDS REPAIR Right   . FOOT SURGERY Right    Social History   Tobacco Use  . Smoking status: Never Smoker  . Smokeless tobacco: Never Used  Substance Use Topics  . Alcohol use: Yes   family history includes Cancer in her brother and mother; Diabetes in her father and sister; Factor V Leiden deficiency in her father and sister; Heart attack in her father and mother; Hyperlipidemia in her sister; Hypertension in her brother, brother, father, sister, and sister; Thyroid cancer in her brother.    ROS: negative except as noted in the HPI  Medications: Current Outpatient Medications  Medication Sig Dispense Refill  . acetaminophen (TYLENOL) 650 MG CR tablet Take 1-2 tablets (650-1,300 mg total) by mouth every 8 (eight) hours as needed for pain. 90 tablet 3  . Cholecalciferol (VITAMIN D PO) Take by mouth.    . cyclobenzaprine (FLEXERIL) 5 MG tablet Take 5 mg by mouth as needed for muscle spasms.    . meloxicam (MOBIC) 15 MG tablet Take 1 tablet (15 mg total) by mouth daily as needed for pain. 30 tablet 3  . metoprolol succinate (TOPROL-XL) 25 MG 24 hr tablet Take 1 tablet (25 mg total) by mouth daily. 90 tablet 0  . olmesartan  (BENICAR) 40 MG tablet Take 1 tablet (40 mg total) by mouth daily. 90 tablet 1  . omeprazole (PRILOSEC) 20 MG capsule Take 20 mg by mouth daily.     No current facility-administered medications for this visit.    Allergies  Allergen Reactions  . Codeine Shortness Of Breath       Objective:  BP 139/73   Pulse (!) 102   Temp 98.5 F (36.9 C) (Oral)   Wt 254 lb (115.2 kg)   BMI 39.78 kg/m    ECG 06/17/2018 9:29 Vent rate 92 bpm PR-I 130 ms QRS 86 ms QT/QTc 358/442 ms  Assessment and Plan: 53 y.o. female with   .Diagnoses and all orders for this visit:  Tachycardia with heart rate 100-120 beats per minute -     metoprolol succinate (TOPROL-XL) 25 MG 24 hr tablet; Take 1 tablet (25 mg total) by mouth daily. -     EKG 12-Lead   ECG personally reviewed by me today showing NSR, normal axis Pulse has consistently been elevated in the 100-110 range at rest in the office Will treat for sinus tachycardia with beta blocker Cont Olmesartan  40 mg QD for HTN Discussed option to obtain echocardiogram to r/o valvular/structural heart disease and CHF. Patient would like to defer for now and I think this is reasonable. She has no symptoms concerning for heart failure   Patient education and anticipatory guidance given Patient agrees with treatment plan Follow-up as needed if symptoms worsen or fail to improve  Levonne Hubert PA-C

## 2018-06-17 NOTE — Patient Instructions (Signed)
Sinus Tachycardia  Sinus tachycardia is a kind of fast heartbeat. In sinus tachycardia, the heart beats more than 100 times a minute. Sinus tachycardia starts in a part of the heart called the sinus node. Sinus tachycardia may be harmless, or it may be a sign of a serious condition. What are the causes? This condition may be caused by:  Exercise or exertion.  A fever.  Pain.  Loss of body fluids (dehydration).  Severe bleeding (hemorrhage).  Anxiety and stress.  Certain substances, including: ? Alcohol. ? Caffeine. ? Tobacco and nicotine products. ? Cold medicines. ? Illegal drugs.  Medical conditions including: ? Heart disease. ? An infection. ? An overactive thyroid (hyperthyroidism). ? A lack of red blood cells (anemia). What are the signs or symptoms? Symptoms of this condition include:  A feeling that the heart is beating quickly (palpitations).  Suddenly noticing your heartbeat (cardiac awareness).  Dizziness.  Tiredness (fatigue).  Shortness of breath.  Chest pain.  Nausea.  Fainting. How is this diagnosed? This condition is diagnosed with:  A physical exam.  Other tests, such as: ? Blood tests. ? An electrocardiogram (ECG). This test measures the electrical activity of the heart. ? Ambulatory cardiac monitor. This records your heartbeats for 24 hours or more. You may be referred to a heart specialist (cardiologist). How is this treated? Treatment for this condition depends on the cause or the underlying condition. Treatment may involve:  Treating the underlying condition.  Taking new medicines or changing your current medicines as told by your health care provider.  Making changes to your diet or lifestyle. Follow these instructions at home: Lifestyle   Do not use any products that contain nicotine or tobacco, such as cigarettes and e-cigarettes. If you need help quitting, ask your health care provider.  Do not use illegal drugs, such as  cocaine.  Learn relaxation methods to help you when you get stressed or anxious. These include deep breathing.  Avoid caffeine or other stimulants. Alcohol use   Do not drink alcohol if: ? Your health care provider tells you not to drink. ? You are pregnant, may be pregnant, or are planning to become pregnant.  If you drink alcohol, limit how much you have: ? 0-1 drink a day for women. ? 0-2 drinks a day for men.  Be aware of how much alcohol is in your drink. In the [place] one drink equals one typical bottle of beer (12 oz), one-half glass of wine (5 oz), or one shot of hard liquor (1 oz). General instructions  Drink enough fluids to keep your urine pale yellow.  Take over-the-counter and prescription medicines only as told by your health care provider.  Keep all follow-up visits as told by your health care provider. This is important. Contact a health care provider if you have:  A fever.  Vomiting or diarrhea that does not go away. Get help right away if you:  Have pain in your chest, upper arms, jaw, or neck.  Become weak or dizzy.  Feel faint.  Have palpitations that do not go away. Summary  In sinus tachycardia, the heart beats more than 100 times a minute.  Sinus tachycardia may be harmless, or it may be a sign of a serious condition.  Treatment for this condition depends on the cause or the underlying condition.  Get help right away if you have pain in your chest, upper arms, jaw, or neck. This information is not intended to replace advice given to you by   your health care provider. Make sure you discuss any questions you have with your health care provider. Document Released: 06/28/2004 Document Revised: 07/10/2017 Document Reviewed: 07/10/2017 Elsevier Interactive Patient Education  2019 Elsevier Inc.  

## 2018-07-07 ENCOUNTER — Ambulatory Visit: Payer: 59 | Admitting: Family Medicine

## 2018-07-07 ENCOUNTER — Encounter: Payer: Self-pay | Admitting: Family Medicine

## 2018-07-07 VITALS — BP 153/79 | HR 86 | Ht 66.0 in | Wt 258.0 lb

## 2018-07-07 DIAGNOSIS — M79642 Pain in left hand: Secondary | ICD-10-CM | POA: Diagnosis not present

## 2018-07-07 MED ORDER — DICLOFENAC SODIUM 1 % TD GEL: 2.0000 g | Freq: Four times a day (QID) | TRANSDERMAL | 11 refills | Status: AC

## 2018-07-07 NOTE — Patient Instructions (Signed)
Thank you for coming in today. Please attend hand therapy.  Use topical diclofenac gel up to 4x daily as needed for pain.  Work on hand motion exercises at home and with hand PT guidance.   Recheck in 6 weeks.    Dupuytren's Contracture Dupuytren's contracture is a condition in which tissue under the skin of the palm becomes thick. This causes one or more of the fingers to curl inward (contract) toward the palm. After a while, the fingers may not be able to straighten out. This condition affects some or all of the fingers and the palm of the hand. This condition may affect one or both hands. Dupuytren's contracture is a long-term (chronic) condition that develops (progresses) slowly over time. There is no cure, but symptoms can be managed and progression can be slowed with treatment. This condition is usually not dangerous or painful, but it can interfere with everyday tasks. What are the causes?  This condition is caused by tissue (fascia) in the palm that gets thicker and tighter. When the fascia thickens, it pulls on the cords of tissue (tendons) that control finger movement. This causes the fingers to contract. The cause of fascia thickening is not known. However, the condition is often passed along from parent to child (inherited). What increases the risk? The following factors may make you more likely to develop this condition:  Being 54 years of age or older.  Being female.  Having a family history of this condition.  Using tobacco products, including cigarettes, chewing tobacco, and e-cigarettes.  Drinking alcohol excessively.  Having diabetes.  Having a seizure disorder. What are the signs or symptoms? Early symptoms of this condition may include:  Thick, puckered skin on the hand.  One or more lumps (nodules) on the palm. Nodules may be tender when they first appear, but they are generally painless. Later symptoms of this condition may include:  Thick cords of tissue in  the palm.  Fingers curled up toward the palm.  Inability to straighten the fingers into their normal position. Though this condition is usually painless, you may have discomfort when holding or grabbing objects. How is this diagnosed? This condition is diagnosed with a physical exam, which may include:  Looking at your hands and feeling your palms. This is to check for thickened fascia and nodules.  Measuring finger motion.  Doing the [place] tabletop test. You may be asked to try to put your hand on a surface, with your palm down and your fingers straight out. How is this treated? There is no cure for this condition, but treatment can relieve discomfort and make symptoms more manageable. Treatment options may include:  Physical therapy. This can strengthen your hand and increase flexibility.  Occupational therapy. This can help you with everyday tasks that may be more difficult because of your condition.  Shots (injections). Substances may be injected into your hand, such as: ? Medicines that help to decrease swelling (corticosteroids). ? Proteins (collagenase) to weaken thick tissue. After a collagenase injection, your health care provider may stretch your fingers.  Needle aponeurotomy. A needle is pushed through the skin and into the fascia. Moving the needle against the fascia can weaken or break up the thick tissue.  Surgery. This may be needed if your condition causes discomfort or interferes with everyday activities. Physical therapy is usually needed after surgery. No treatment is guaranteed to cure this condition. Recurrence of symptoms is common. Follow these instructions at home: Hand care  Take these actions  to help protect your hand from possible injury: ? Use tools that have padded grips. ? Wear protective gloves while you work with your hands. ? Avoid repetitive hand movements. General instructions  Take over-the-counter and prescription medicines only as told by  your health care provider.  Manage any other conditions that you have, such as diabetes.  If physical therapy was prescribed, do exercises as told by your health care provider.  Do not use any products that contain nicotine or tobacco, such as cigarettes, e-cigarettes, and chewing tobacco. If you need help quitting, ask your health care provider.  If you drink alcohol: ? Limit how much you use to:  0-1 drink a day for women.  0-2 drinks a day for men. ? Be aware of how much alcohol is in your drink. In the [place] one drink equals one 12 oz bottle of beer (355 mL), one 5 oz glass of wine (148 mL), or one 1 oz glass of hard liquor (44 mL).  Keep all follow-up visits as told by your health care provider. This is important. Contact a health care provider if:  You develop new symptoms, or your symptoms get worse.  You have pain that gets worse or does not get better with medicine.  You have difficulty or discomfort with everyday tasks.  You develop numbness or tingling. Get help right away if:  You have severe pain.  Your fingers change color or become unusually cold. Summary  Dupuytren's contracture is a condition in which tissue under the skin of the palm becomes thick.  This condition is caused by tissue (fascia) that thickens. When it thickens, it pulls on the cords of tissue (tendons) that control finger movement and makes the fingers to contract.  You are more likely to develop this condition if you are a man, are over 54 years of age, have a family history of the condition, and drink a lot of alcohol.  This condition can be treated with physical and occupational therapy, injections, and surgery.  Follow instructions about how to care for your hand. Get help right away if you have severe pain or your fingers change color or become cold. This information is not intended to replace advice given to you by your health care provider. Make sure you discuss any questions you have  with your health care provider. Document Released: 03/18/2009 Document Revised: 12/10/2017 Document Reviewed: 12/10/2017 Elsevier Interactive Patient Education  2019 [employer].

## 2018-07-07 NOTE — Progress Notes (Signed)
Desiree Valencia is a 53 y.o. female who presents to [facility] for left hand pain. Desiree Valencia was seen on 01/14 for right hand pain and found to have a ganglion cyst. She has been wearing the splint and taking Meloxicam for her right wrist, and notes that this pain has resolved.   Today, She is most concerned about her left hand pain. She first noticed a knot in the left palm and some mild pain in her hand around Christmas. The pain has been getting worse since then; she describes it as aching and burning. She is left-handed and has noticed weakness when vacuuming and pushing a grocery cart. Denies numbness and tingling.    ROS:  As above  Exam:  BP (!) 153/79   Pulse 86   Ht 5\' 6"  (1.676 m)   Wt 258 lb (117 kg)   BMI 41.64 kg/m  Wt Readings from Last 5 Encounters:  07/07/18 258 lb (117 kg)  06/17/18 254 lb (115.2 kg)  06/09/18 255 lb (115.7 kg)  12/19/17 251 lb (113.9 kg)  11/18/17 250 lb (113.4 kg)   General: Well Developed, well nourished, and in no acute distress.  Neuro/Psych: Alert and oriented x3, extra-ocular muscles intact, able to move all 4 extremities, sensation grossly intact. Skin: Warm and dry, no rashes noted.  Respiratory: Not using accessory muscles, speaking in full sentences, trachea midline.  Cardiovascular: Pulses palpable, no extremity edema. Abdomen: Does not appear distended. MSK:  Left hand: Normal appearance. No erythema, edema, or rashes. Palpable small superficial nodule ulnar palm near proximal fourth metacarpal   Mildly tender to palpation over the ulnar side of palm.  Normal ROM of fingers and wrist.  Grip strength 4/5 compared to right hand. Finger abduction strength 4/5 compared to right hand.  Strength 5/5 with resisted wrist flexion, extension, and ulnar and radial deviation.  Strength 5/5 with resisted extension of third digit.  Pulses and capillary refill normal.    Right hand: Normal  appearance. No erythema, edema, or rashes.  No tenderness to palpation.  Normal ROM of fingers and wrist. Grip strength 5/5.  Finger abduction strength 5/5. Strength 5/5 with resisted wrist flexion, extension, and ulnar and radial deviation.  Pulses and capillary refill normal.    Lab and Radiology Results  Dg Wrist Complete Right  Result Date: 06/09/2018 CLINICAL DATA:  BILATERAL hand pain for 2 weeks.  No known injury. EXAM: RIGHT WRIST - COMPLETE 3+ VIEW COMPARISON:  None. FINDINGS: There is no evidence of fracture or dislocation. There is no evidence of arthropathy or other focal bone abnormality. Soft tissues are unremarkable. IMPRESSION: Negative. Electronically Signed   By: Elsie Stain M.D.   On: 06/09/2018 20:25   Dg Hand Complete Left  Result Date: 06/09/2018 CLINICAL DATA:  BILATERAL hand pain for 2 weeks.  No injury. EXAM: LEFT HAND - COMPLETE 3+ VIEW COMPARISON:  None. FINDINGS: There is no evidence of fracture or dislocation. There is no evidence of arthropathy or other focal bone abnormality. Soft tissues are unremarkable. IMPRESSION: Negative. Electronically Signed   By: Elsie Stain M.D.   On: 06/09/2018 20:23   Dg Hand Complete Right  Result Date: 06/09/2018 CLINICAL DATA:  BILATERAL hand pain for 2 weeks. No known injury. EXAM: RIGHT HAND - COMPLETE 3+ VIEW COMPARISON:  None. FINDINGS: There is no evidence of fracture or dislocation. There is no evidence of arthropathy or other focal bone abnormality. Soft tissues are unremarkable. Minor degenerative change  at the DIP joints. IMPRESSION: Negative. Electronically Signed   By: Elsie StainJohn T Curnes M.D.   On: 06/09/2018 20:24  I personally (independently) visualized and performed the interpretation of the images attached in this note.   Limited musculoskeletal ultrasound of the left hand palmar nodule.  Small noncystic nodule visualized superficially in the subcutaneous tissue of the ulnar left hand.  Nodule measures about 3 x 2 mm.   No vascular flow associated with nodule. Normal bony structures. Pression: Solid nodule   Assessment and Plan: 53 y.o. female with Left hand pain: Desiree Valencia has had worsening achey pain over the knot felt in the ulnar side of her left palm since Christmas. She has had noticeable weakness compared to her right hand. On exam, she does have decreased grip strength and finger abduction compared to her right hand. X-ray from 01/06 did not show evidence of fracture. Ultrasound today revealed a 1/3 cm x 1/4 cm nodule over the ulnar side of her palm. Could possibly be the start of Dupuytren's contracture. Advised going to hand therapy and doing home hand exercises. Prescribed topical diclofenac gel for pain. Follow-up in 6 weeks.    PDMP not reviewed this encounter. Orders Placed This Encounter  Procedures  . Ambulatory referral to Occupational Therapy    Referral Priority:   Routine    Referral Type:   Occupational Therapy    Referral Reason:   Specialty Services Required    Requested Specialty:   Occupational Therapy    Number of Visits Requested:   1   Meds ordered this encounter  Medications  . diclofenac sodium (VOLTAREN) 1 % GEL    Sig: Apply 2 g topically 4 (four) times daily. To affected joint.    Dispense:  100 g    Refill:  11    Historical information moved to improve visibility of documentation.  Past Medical History:  Diagnosis Date  . Heterozygous factor V Leiden mutation (HCC) 10/21/2017  . Hypertension   . Multiple thyroid nodules   . Osteoarthritis of hand    Past Surgical History:  Procedure Laterality Date  . ARTHROSCOPIC HAGLUNDS REPAIR Right   . FOOT SURGERY Right    Social History   Tobacco Use  . Smoking status: Never Smoker  . Smokeless tobacco: Never Used  Substance Use Topics  . Alcohol use: Yes   family history includes Cancer in her brother and mother; Diabetes in her father and sister; Factor V Leiden deficiency in her father and sister; Heart  attack in her father and mother; Hyperlipidemia in her sister; Hypertension in her brother, brother, father, sister, and sister; Thyroid cancer in her brother.  Medications: Current Outpatient Medications  Medication Sig Dispense Refill  . acetaminophen (TYLENOL) 650 MG CR tablet Take 1-2 tablets (650-1,300 mg total) by mouth every 8 (eight) hours as needed for pain. 90 tablet 3  . Cholecalciferol (VITAMIN D PO) Take by mouth.    . cyclobenzaprine (FLEXERIL) 5 MG tablet Take 5 mg by mouth as needed for muscle spasms.    . meloxicam (MOBIC) 15 MG tablet Take 1 tablet (15 mg total) by mouth daily as needed for pain. 30 tablet 3  . metoprolol succinate (TOPROL-XL) 25 MG 24 hr tablet Take 1 tablet (25 mg total) by mouth daily. 90 tablet 0  . olmesartan (BENICAR) 40 MG tablet Take 1 tablet (40 mg total) by mouth daily. 90 tablet 1  . omeprazole (PRILOSEC) 20 MG capsule Take 20 mg by mouth daily.    .Marland Kitchen  diclofenac sodium (VOLTAREN) 1 % GEL Apply 2 g topically 4 (four) times daily. To affected joint. 100 g 11   No current facility-administered medications for this visit.    Allergies  Allergen Reactions  . Codeine Shortness Of Breath      Discussed warning signs or symptoms. Please see discharge instructions. Patient expresses understanding.  I personally was present and performed or re-performed the history, physical exam and medical decision-making activities of this service and have verified that the service and findings are accurately documented in the student's note. ___________________________________________ Clementeen Graham M.D., ABFM., CAQSM. Primary Care and Sports Medicine Adjunct Instructor of Family Medicine  [employer]

## 2018-08-12 ENCOUNTER — Telehealth: Payer: Self-pay | Admitting: Family Medicine

## 2018-08-12 NOTE — Telephone Encounter (Signed)
Received occupational therapy discharge note.  Patient had improved symptoms and plans to do home exercise program at home.  Patient has follow-up visit with me on August 18, 2018.  She had functional index standard test with disability index score 45 initially improved to score of 10 on discharge.

## 2018-08-18 ENCOUNTER — Encounter: Payer: Self-pay | Admitting: Physician Assistant

## 2018-08-18 ENCOUNTER — Ambulatory Visit: Payer: 59 | Admitting: Physician Assistant

## 2018-08-18 ENCOUNTER — Other Ambulatory Visit: Payer: Self-pay

## 2018-08-18 ENCOUNTER — Encounter: Payer: Self-pay | Admitting: Family Medicine

## 2018-08-18 ENCOUNTER — Ambulatory Visit: Payer: 59 | Admitting: Family Medicine

## 2018-08-18 VITALS — BP 132/83 | HR 91 | Wt 253.0 lb

## 2018-08-18 DIAGNOSIS — I10 Essential (primary) hypertension: Secondary | ICD-10-CM

## 2018-08-18 DIAGNOSIS — J301 Allergic rhinitis due to pollen: Secondary | ICD-10-CM

## 2018-08-18 DIAGNOSIS — R000 Tachycardia, unspecified: Secondary | ICD-10-CM

## 2018-08-18 DIAGNOSIS — G5622 Lesion of ulnar nerve, left upper limb: Secondary | ICD-10-CM

## 2018-08-18 DIAGNOSIS — E042 Nontoxic multinodular goiter: Secondary | ICD-10-CM

## 2018-08-18 DIAGNOSIS — M79642 Pain in left hand: Secondary | ICD-10-CM

## 2018-08-18 DIAGNOSIS — J309 Allergic rhinitis, unspecified: Secondary | ICD-10-CM

## 2018-08-18 DIAGNOSIS — H1013 Acute atopic conjunctivitis, bilateral: Secondary | ICD-10-CM | POA: Insufficient documentation

## 2018-08-18 MED ORDER — LEVOCETIRIZINE DIHYDROCHLORIDE 5 MG PO TABS: 5.0000 mg | ORAL_TABLET | Freq: Every evening | ORAL | 3 refills | Status: DC

## 2018-08-18 MED ORDER — OLOPATADINE HCL 0.2 % OP SOLN: 1.0000 [drp] | Freq: Every day | OPHTHALMIC | 11 refills | Status: DC

## 2018-08-18 MED ORDER — METOPROLOL SUCCINATE ER 25 MG PO TB24: 25.0000 mg | ORAL_TABLET | Freq: Every day | ORAL | 1 refills | Status: DC

## 2018-08-18 NOTE — Patient Instructions (Signed)
Thank you for coming in today. Keep working on hand PT home exercises.   Consider cubital tunnel syndrome for the pinkey numbness.   Recheck with me as needed.    Cubital Tunnel Syndrome  Cubital tunnel syndrome is a condition that causes pain and weakness of the forearm and hand. It happens when one of the nerves that runs along the inside of the elbow joint (ulnar nerve) becomes irritated. This condition is usually caused by repeated arm motions that are done during sports or work-related activities. What are the causes? This condition may be caused by:  Increased pressure on the ulnar nerve at the elbow, arm, or forearm. This can result from: ? Irritation caused by repeated elbow bending. ? Poorly healed elbow fractures. ? Tumors in the elbow. These are usually noncancerous (benign). ? Scar tissue that develops in the elbow after an injury. ? Bony growths (spurs) near the ulnar nerve.  Stretching of the nerve due to loose elbow ligaments.  Trauma to the nerve at the elbow. What increases the risk? The following factors may make you more likely to develop this condition:  Doing manual labor that requires frequent bending of the elbow.  Playing sports that include repeated or strenuous throwing motions, such as baseball.  Playing contact sports, such as football or lacrosse.  Not warming up properly before activities.  Having diabetes.  Having an underactive thyroid (hypothyroidism). What are the signs or symptoms? Symptoms of this condition include:  Clumsiness or weakness of the hand.  Tenderness of the inner elbow.  Aching or soreness of the inner elbow, forearm, or fingers, especially the little finger or the ring finger.  Increased pain when forcing the elbow to bend.  Reduced control when throwing objects.  Tingling, numbness, or a burning feeling inside the forearm or in part of the hand or fingers, especially the little finger or the ring finger.  Sharp  pains that shoot from the elbow down to the wrist and hand.  The inability to grip or pinch hard. How is this diagnosed? This condition is diagnosed based on:  Your symptoms and medical history. Your health care provider will also ask for details about any injury.  A physical exam. You may also have tests, including:  Electromyogram (EMG). This test measures electrical signals sent by your nerves into the muscles.  Nerve conduction study. This test measures how well electrical signals pass through your nerves.  Imaging tests, such as X-rays, ultrasound, and MRI. These tests check for possible causes of your condition. How is this treated? This condition may be treated by:  Stopping the activities that are causing your symptoms to get worse.  Icing and taking medicines to reduce pain and swelling.  Wearing a splint to prevent your elbow from bending, or wearing an elbow pad where the ulnar nerve is closest to the skin.  Working with a physical therapist in less severe cases. This may help to: ? Decrease your symptoms. ? Improve the strength and range of motion of your elbow, forearm, and hand. If these treatments do not help, surgery may be needed. Follow these instructions at home: If you have a splint:  Wear the splint as told by your health care provider. Remove it only as told by your health care provider.  Loosen the splint if your fingers tingle, become numb, or turn cold and blue.  Keep the splint clean.  If the splint is not waterproof: ? Do not let it get wet. ? Cover it  with a watertight covering when you take a bath or shower. Managing pain, stiffness, and swelling   If directed, put ice on the injured area: ? Put ice in a plastic bag. ? Place a towel between your skin and the bag. ? Leave the ice on for 20 minutes, 2-3 times a day.  Move your fingers often to avoid stiffness and to lessen swelling.  Raise (elevate) the injured area above the level of your  heart while you are sitting or lying down. General instructions  Take over-the-counter and prescription medicines only as told by your health care provider.  Do any exercise or physical therapy as told by your health care provider.  Do not drive or use heavy machinery while taking prescription pain medicine.  If you were given an elbow pad, wear it as told by your health care provider.  Keep all follow-up visits as told by your health care provider. This is important. Contact a health care provider if:  Your symptoms get worse.  Your symptoms do not get better with treatment.  You have new pain.  Your hand on the injured side feels numb or cold. Summary  Cubital tunnel syndrome is a condition that causes pain and weakness of the forearm and hand.  You are more likely to develop this condition if you do work or play sports that involve repeated arm movements.  This condition is often treated by stopping repetitive activities, applying ice, and using anti-inflammatory medicines.  In rare cases, surgery may be needed. This information is not intended to replace advice given to you by your health care provider. Make sure you discuss any questions you have with your health care provider. Document Released: 05/21/2005 Document Revised: 10/07/2017 Document Reviewed: 10/07/2017 Elsevier Interactive Patient Education  2019 [employer].

## 2018-08-18 NOTE — Patient Instructions (Signed)
For your blood pressure: - Goal <130/80 (Ideally 120's/70's) - take your blood pressure medication in the morning (unless instructed differently) - monitor and log blood pressures at home - check around the same time each day in a relaxed setting - Limit salt to <2500 mg/day - Follow DASH (Dietary Approach to Stopping Hypertension) eating plan - Try to get at least 150 minutes of aerobic exercise per week - Aim to go on a brisk walk 30 minutes per day at least 5 days per week. If you're not active, gradually increase how long you walk by 5 minutes each week - limit alcohol: 2 standard drinks per day for men and 1 per day for women - avoid tobacco/nicotine products. Consider smoking cessation if you smoke - weight loss: 7% of current body weight can reduce your blood pressure by 5-10 points - follow-up at least every 6 months for your blood pressure. Follow-up sooner if your BP is not controlled   Sinus Tachycardia  Sinus tachycardia is a kind of fast heartbeat. In sinus tachycardia, the heart beats more than 100 times a minute. Sinus tachycardia starts in a part of the heart called the sinus node. Sinus tachycardia may be harmless, or it may be a sign of a serious condition. What are the causes? This condition may be caused by:  Exercise or exertion.  A fever.  Pain.  Loss of body fluids (dehydration).  Severe bleeding (hemorrhage).  Anxiety and stress.  Certain substances, including: ? Alcohol. ? Caffeine. ? Tobacco and nicotine products. ? Cold medicines. ? Illegal drugs.  Medical conditions including: ? Heart disease. ? An infection. ? An overactive thyroid (hyperthyroidism). ? A lack of red blood cells (anemia). What are the signs or symptoms? Symptoms of this condition include:  A feeling that the heart is beating quickly (palpitations).  Suddenly noticing your heartbeat (cardiac awareness).  Dizziness.  Tiredness (fatigue).  Shortness of breath.  Chest  pain.  Nausea.  Fainting. How is this diagnosed? This condition is diagnosed with:  A physical exam.  Other tests, such as: ? Blood tests. ? An electrocardiogram (ECG). This test measures the electrical activity of the heart. ? Ambulatory cardiac monitor. This records your heartbeats for 24 hours or more. You may be referred to a heart specialist (cardiologist). How is this treated? Treatment for this condition depends on the cause or the underlying condition. Treatment may involve:  Treating the underlying condition.  Taking new medicines or changing your current medicines as told by your health care provider.  Making changes to your diet or lifestyle. Follow these instructions at home: Lifestyle   Do not use any products that contain nicotine or tobacco, such as cigarettes and e-cigarettes. If you need help quitting, ask your health care provider.  Do not use illegal drugs, such as cocaine.  Learn relaxation methods to help you when you get stressed or anxious. These include deep breathing.  Avoid caffeine or other stimulants. Alcohol use   Do not drink alcohol if: ? Your health care provider tells you not to drink. ? You are pregnant, may be pregnant, or are planning to become pregnant.  If you drink alcohol, limit how much you have: ? 0-1 drink a day for women. ? 0-2 drinks a day for men.  Be aware of how much alcohol is in your drink. In the [place] one drink equals one typical bottle of beer (12 oz), one-half glass of wine (5 oz), or one shot of hard liquor (1 oz).  General instructions  Drink enough fluids to keep your urine pale yellow.  Take over-the-counter and prescription medicines only as told by your health care provider.  Keep all follow-up visits as told by your health care provider. This is important. Contact a health care provider if you have:  A fever.  Vomiting or diarrhea that does not go away. Get help right away if you:  Have pain in  your chest, upper arms, jaw, or neck.  Become weak or dizzy.  Feel faint.  Have palpitations that do not go away. Summary  In sinus tachycardia, the heart beats more than 100 times a minute.  Sinus tachycardia may be harmless, or it may be a sign of a serious condition.  Treatment for this condition depends on the cause or the underlying condition.  Get help right away if you have pain in your chest, upper arms, jaw, or neck. This information is not intended to replace advice given to you by your health care provider. Make sure you discuss any questions you have with your health care provider. Document Released: 06/28/2004 Document Revised: 07/10/2017 Document Reviewed: 07/10/2017 Elsevier Interactive Patient Education  2019 [employer].

## 2018-08-18 NOTE — Progress Notes (Signed)
HPI:                                                                Desiree Valencia is a 53 y.o. female who presents to [facility] Desiree Valencia: Primary Care Sports Medicine today for tachycardia/HTN follow-up  Patient reports for the last 3 years she has noticed elevated heart rate and palpitations that she describes as "feeling like my insides are racing." Symptoms occur both at rest and with exertion. She denies any exertional chest pain, change in exercise tolerance, dyspnea or peripheral edema. She regularly walks 1-2 miles daily.  08/18/2018 ECG 06/17/18 showed NSR and she was started on Metoprolol XL 25 mg Reports home heart rates are typically in the 60's - 70's She states she feels much better since starting the beta blocker. She feels more calm and has not had any palpitations Reports diastolic blood pressure is running lower, systolic is about the same. No dizziness or lightheadedness. Initially some fatigue in the first 2 weeks, but this went away  Endocrinologist said she no longer needs to follow-up with him regarding multiple thyroid nodules. She would like [place] to take over surveillance of her nodules. She does not have hypothyroidism.  Lastly she has been having trouble finding Allegra over-the-counter. She complains of itchy, watery eyes and itchy, runny nose.  Past Medical History:  Diagnosis Date  . Heterozygous factor V Leiden mutation (HCC) 10/21/2017  . Hypertension   . Multiple thyroid nodules   . Osteoarthritis of hand    Past Surgical History:  Procedure Laterality Date  . ARTHROSCOPIC HAGLUNDS REPAIR Right   . FOOT SURGERY Right    Social History   Tobacco Use  . Smoking status: Never Smoker  . Smokeless tobacco: Never Used  Substance Use Topics  . Alcohol use: Yes   family history includes Cancer in her brother and mother; Diabetes in her father and sister; Factor V Leiden deficiency in her father and sister; Heart attack in her father and mother;  Hyperlipidemia in her sister; Hypertension in her brother, brother, father, sister, and sister; Thyroid cancer in her brother.    ROS: negative except as noted in the HPI  Medications: Current Outpatient Medications  Medication Sig Dispense Refill  . acetaminophen (TYLENOL) 650 MG CR tablet Take 1-2 tablets (650-1,300 mg total) by mouth every 8 (eight) hours as needed for pain. 90 tablet 3  . Cholecalciferol (VITAMIN D PO) Take by mouth.    . cyclobenzaprine (FLEXERIL) 5 MG tablet Take 5 mg by mouth as needed for muscle spasms.    . diclofenac sodium (VOLTAREN) 1 % GEL Apply 2 g topically 4 (four) times daily. To affected joint. 100 g 11  . levocetirizine (XYZAL ALLERGY 24HR) 5 MG tablet Take 1 tablet (5 mg total) by mouth every evening. 90 tablet 3  . meloxicam (MOBIC) 15 MG tablet Take 1 tablet (15 mg total) by mouth daily as needed for pain. 30 tablet 3  . metoprolol succinate (TOPROL-XL) 25 MG 24 hr tablet Take 1 tablet (25 mg total) by mouth daily. 90 tablet 1  . olmesartan (BENICAR) 40 MG tablet Take 1 tablet (40 mg total) by mouth daily. 90 tablet 1  . Olopatadine HCl (PATADAY) 0.2 % SOLN Apply 1 drop to eye  daily. 2.5 mL 11  . omeprazole (PRILOSEC) 20 MG capsule Take 20 mg by mouth daily.     No current facility-administered medications for this visit.    Allergies  Allergen Reactions  . Codeine Shortness Of Breath       Objective:  BP 132/83   Pulse 91   Wt 253 lb (114.8 kg)   BMI 40.84 kg/m  Gen:  alert, not ill-appearing, no distress, appropriate for age, obese female HEENT: head normocephalic without obvious abnormality, conjunctiva and cornea clear, wearing glasses, trachea midline Pulm: Normal work of breathing, normal phonation, clear to auscultation bilaterally, no wheezes, rales or rhonchi CV: Normal rate, regular rhythm, s1 and s2 distinct, no murmurs, clicks or rubs  Neuro: alert and oriented x 3, no tremor MSK: extremities atraumatic, normal gait and  station, no peripheral edema Skin: intact, no rashes on exposed skin, no jaundice, no cyanosis Psych: well-groomed, cooperative, good eye contact, euthymic mood, affect mood-congruent, speech is articulate, and thought processes clear and goal-directed  Lab Results  Component Value Date   CREATININE 0.86 06/09/2018   BUN 12 06/09/2018   NA 137 06/09/2018   K 4.4 06/09/2018   CL 103 06/09/2018   CO2 25 06/09/2018   Lab Results  Component Value Date   WBC 9.6 06/09/2018   HGB 15.0 06/09/2018   HCT 43.2 06/09/2018   MCV 91.1 06/09/2018   PLT 328 06/09/2018   The 10-year ASCVD risk score Denman George DC Jr., et al., 2013) is: 2.8%   Values used to calculate the score:     Age: 31 years     Sex: Female     Is Non-Hispanic African American: No     Diabetic: No     Tobacco smoker: No     Systolic Blood Pressure: 132 mmHg     Is BP treated: Yes     HDL Cholesterol: 48 mg/dL     Total Cholesterol: 192 mg/dL   No results found for this or any previous visit (from the past 72 hour(s)). No results found.    Assessment and Plan: 53 y.o. female with   .Desiree Valencia was seen today for follow-up.  Diagnoses and all orders for this visit:  Tachycardia with heart rate 100-120 beats per minute -     metoprolol succinate (TOPROL-XL) 25 MG 24 hr tablet; Take 1 tablet (25 mg total) by mouth daily.  Seasonal allergic rhinitis due to pollen -     levocetirizine (XYZAL ALLERGY 24HR) 5 MG tablet; Take 1 tablet (5 mg total) by mouth every evening.  Allergic conjunctivitis of both eyes and rhinitis -     Olopatadine HCl (PATADAY) 0.2 % SOLN; Apply 1 drop to eye daily.  Multiple thyroid nodules Comments: last [place] Jan 2020    Tachycardia Pulse 91 in the office today, patient reports home pulse readings are in the 60s to 70s, she is compliant with her beta-blocker  Hypertension BP goal less than 130/80, nearly at goal today, continue olmesartan 40 mg daily Encouraged heart healthy diet and  regular aerobic exercise  Seasonal allergies Starting Xyzal and Pataday eyedrops  Thyroid nodules Will contact Dr. Jettie Pagan at [facility] regarding recommendations for surveillance ultrasound  Patient education and anticipatory guidance given Patient agrees with treatment plan Follow-up in 6 months or sooner as needed if symptoms worsen or fail to improve  Levonne Hubert PA-C

## 2018-08-18 NOTE — Progress Notes (Signed)
Desiree Valencia is a 53 y.o. female who presents to [name] today for hand pain.  Desiree Valencia was seen on February 3 for left hand pain.  She had a cutaneous nodule was thought to have potentially early Dupuytren's contracture.  She is done extremely well with hand therapy and is very happy with how things are going.  Her grip is improved and she feels as though she is back to normal.  Note she is developed a little bit of numbness and tingling into her pinky.  Her hand therapist was concerned for cubital tunnel syndrome and has been given her some exercises to do which have been helpful as well. ROS as above:  Exam:  BP 132/83   Pulse 91   Wt 253 lb (114.8 kg)   BMI 40.84 kg/m  Wt Readings from Last 5 Encounters:  08/18/18 253 lb (114.8 kg)  08/18/18 253 lb (114.8 kg)  07/07/18 258 lb (117 kg)  06/17/18 254 lb (115.2 kg)  06/09/18 255 lb (115.7 kg)    Gen: Well NAD Left hand: Normal-appearing nontender normal motion. Left elbow: Normal-appearing nontender normal motion minimally positive Tinel's at cubital tunnel.  Lab and Radiology Results No results found for this or any previous visit (from the past 72 hour(s)). No results found.    Assessment and Plan: 53 y.o. female with  Left hand pain: Doing quite well.  Continue home exercises recheck as needed.  Possible early cubital tunnel syndrome: Doing well with home exercise program.  Recheck as needed.  PDMP not reviewed this encounter. No orders of the defined types were placed in this encounter.  No orders of the defined types were placed in this encounter.    Historical information moved to improve visibility of documentation.  Past Medical History:  Diagnosis Date  . Heterozygous factor V Leiden mutation (HCC) 10/21/2017  . Hypertension   . Multiple thyroid nodules   . Osteoarthritis of hand     Past Surgical History:  Procedure Laterality Date  . ARTHROSCOPIC HAGLUNDS REPAIR Right   . FOOT SURGERY Right    Social History   Tobacco Use  . Smoking status: Never Smoker  . Smokeless tobacco: Never Used  Substance Use Topics  . Alcohol use: Yes   family history includes Cancer in her brother and mother; Diabetes in her father and sister; Factor V Leiden deficiency in her father and sister; Heart attack in her father and mother; Hyperlipidemia in her sister; Hypertension in her brother, brother, father, sister, and sister; Thyroid cancer in her brother.  Medications: Current Outpatient Medications  Medication Sig Dispense Refill  . acetaminophen (TYLENOL) 650 MG CR tablet Take 1-2 tablets (650-1,300 mg total) by mouth every 8 (eight) hours as needed for pain. 90 tablet 3  . Cholecalciferol (VITAMIN D PO) Take by mouth.    . cyclobenzaprine (FLEXERIL) 5 MG tablet Take 5 mg by mouth as needed for muscle spasms.    . diclofenac sodium (VOLTAREN) 1 % GEL Apply 2 g topically 4 (four) times daily. To affected joint. 100 g 11  . levocetirizine (XYZAL ALLERGY 24HR) 5 MG tablet Take 1 tablet (5 mg total) by mouth every evening. 90 tablet 3  . meloxicam (MOBIC) 15 MG tablet Take 1 tablet (15 mg total) by mouth daily as needed for pain. 30 tablet 3  . metoprolol succinate (TOPROL-XL) 25 MG 24 hr tablet Take 1 tablet (25 mg total) by mouth daily.  90 tablet 1  . olmesartan (BENICAR) 40 MG tablet Take 1 tablet (40 mg total) by mouth daily. 90 tablet 1  . Olopatadine HCl (PATADAY) 0.2 % SOLN Apply 1 drop to eye daily. 2.5 mL 11  . omeprazole (PRILOSEC) 20 MG capsule Take 20 mg by mouth daily.     No current facility-administered medications for this visit.    Allergies  Allergen Reactions  . Codeine Shortness Of Breath     Discussed warning signs or symptoms. Please see discharge instructions. Patient expresses understanding.

## 2018-10-09 ENCOUNTER — Other Ambulatory Visit: Payer: Self-pay | Admitting: Physician Assistant

## 2018-10-16 ENCOUNTER — Other Ambulatory Visit: Payer: Self-pay | Admitting: Physician Assistant

## 2018-10-17 ENCOUNTER — Encounter: Payer: Self-pay | Admitting: Physician Assistant

## 2018-11-20 ENCOUNTER — Other Ambulatory Visit: Payer: Self-pay | Admitting: Physician Assistant

## 2018-11-20 DIAGNOSIS — M19041 Primary osteoarthritis, right hand: Secondary | ICD-10-CM

## 2018-12-23 ENCOUNTER — Encounter: Payer: Self-pay | Admitting: Physician Assistant

## 2018-12-31 ENCOUNTER — Ambulatory Visit: Payer: 59 | Admitting: Physician Assistant

## 2018-12-31 ENCOUNTER — Encounter: Payer: Self-pay | Admitting: Physician Assistant

## 2018-12-31 ENCOUNTER — Other Ambulatory Visit: Payer: Self-pay

## 2018-12-31 VITALS — BP 113/71 | HR 71 | Temp 98.4°F

## 2018-12-31 DIAGNOSIS — E785 Hyperlipidemia, unspecified: Secondary | ICD-10-CM

## 2018-12-31 DIAGNOSIS — N289 Disorder of kidney and ureter, unspecified: Secondary | ICD-10-CM

## 2018-12-31 DIAGNOSIS — I10 Essential (primary) hypertension: Secondary | ICD-10-CM | POA: Diagnosis not present

## 2018-12-31 DIAGNOSIS — Z5181 Encounter for therapeutic drug level monitoring: Secondary | ICD-10-CM

## 2018-12-31 DIAGNOSIS — R002 Palpitations: Secondary | ICD-10-CM

## 2018-12-31 DIAGNOSIS — Z1231 Encounter for screening mammogram for malignant neoplasm of breast: Secondary | ICD-10-CM

## 2018-12-31 MED ORDER — OLMESARTAN MEDOXOMIL 40 MG PO TABS: 40.0000 mg | ORAL_TABLET | Freq: Every day | ORAL | 1 refills | Status: DC

## 2018-12-31 NOTE — Progress Notes (Signed)
Virtual Visit via Telephone Note  I connected with Desiree Valencia on 12/31/18 at 10:30 AM EDT by telephone and verified that I am speaking with the correct person using two identifiers.   I discussed the limitations of evaluation and management by telemedicine and the availability of in person appointments. The patient expressed understanding and agreed to proceed.  History of Present Illness: HPI:                                                                Desiree Valencia is a 53 y.o. female   CC: medication management  HTN: taking Olmesartan 40 mg daily. Compliant with medications. Checks BP's at home. BP range 110-120/70's. Denies vision change, headache, chest pain with exertion, orthopnea, lightheadedness, syncope and edema. Risk factors include:  Palpitations: reports resting heart rate has been high 60's to low 70's She states she has been drinking more water and less coffee/tea. She has been physically active gardening. No exertional CP or dyspnea.   Past Medical History:  Diagnosis Date  . Heterozygous factor V Leiden mutation ([place]) 10/21/2017  . Hypertension   . Multiple thyroid nodules   . Osteoarthritis of hand    Past Surgical History:  Procedure Laterality Date  . ARTHROSCOPIC HAGLUNDS REPAIR Right   . FOOT SURGERY Right    Social History   Tobacco Use  . Smoking status: Never Smoker  . Smokeless tobacco: Never Used  Substance Use Topics  . Alcohol use: Yes   family history includes Cancer in her brother and mother; Diabetes in her father and sister; Factor V Leiden deficiency in her father and sister; Heart attack in her father and mother; Hyperlipidemia in her sister; Hypertension in her brother, brother, father, sister, and sister; Thyroid cancer in her brother.    ROS: negative except as noted in the HPI  Medications: Current Outpatient Medications  Medication Sig Dispense Refill  . acetaminophen (TYLENOL) 650 MG CR tablet Take 1-2 tablets (650-1,300  mg total) by mouth every 8 (eight) hours as needed for pain. 90 tablet 3  . Cholecalciferol (VITAMIN D PO) Take by mouth.    . cyclobenzaprine (FLEXERIL) 5 MG tablet Take 5 mg by mouth as needed for muscle spasms.    . diclofenac sodium (VOLTAREN) 1 % GEL Apply 2 g topically 4 (four) times daily. To affected joint. 100 g 11  . levocetirizine (XYZAL ALLERGY 24HR) 5 MG tablet Take 1 tablet (5 mg total) by mouth every evening. 90 tablet 3  . metoprolol succinate (TOPROL-XL) 25 MG 24 hr tablet Take 1 tablet (25 mg total) by mouth daily. 90 tablet 1  . olmesartan (BENICAR) 40 MG tablet Take 1 tablet (40 mg total) by mouth daily. 90 tablet 0  . Olopatadine HCl (PATADAY) 0.2 % SOLN Apply 1 drop to eye daily. 2.5 mL 11  . omeprazole (PRILOSEC) 20 MG capsule Take 20 mg by mouth daily.    . meloxicam (MOBIC) 15 MG tablet Take 1 tablet (15 mg total) by mouth daily as needed for pain. (Patient not taking: Reported on 12/31/2018) 30 tablet 3   No current facility-administered medications for this visit.    Allergies  Allergen Reactions  . Codeine Shortness Of Breath       Objective:  BP 113/71  Pulse 71   Temp 98.4 F (36.9 C) (Oral)  Wt Readings from Last 3 Encounters:  08/18/18 253 lb (114.8 kg)  08/18/18 253 lb (114.8 kg)  07/07/18 258 lb (117 kg)   Temp Readings from Last 3 Encounters:  12/31/18 98.4 F (36.9 C) (Oral)  06/17/18 98.5 F (36.9 C) (Oral)   BP Readings from Last 3 Encounters:  12/31/18 113/71  08/18/18 132/83  08/18/18 132/83   Pulse Readings from Last 3 Encounters:  12/31/18 71  08/18/18 91  08/18/18 91    Pulm: Normal work of breathing, normal phonation Neuro: alert and oriented x 3 Psych: cooperative, euthymic mood, affect mood-congruent, speech is articulate, normal rate and volume; thought processes clear and goal-directed, normal judgment, good insight   No results found for this or any previous visit (from the past 72 hour(s)). No results  found.    Assessment and Plan: 53 y.o. female with   .Desiree Valencia was seen today for medication management.  Diagnoses and all orders for this visit:  Hypertension goal BP (blood pressure) < 130/80 -     olmesartan (BENICAR) 40 MG tablet; Take 1 tablet (40 mg total) by mouth daily. -     BASIC METABOLIC PANEL WITH GFR  Renal insufficiency -     BASIC METABOLIC PANEL WITH GFR -     CBC  Dyslipidemia, goal LDL below 100 -     Lipid Panel w/reflex Direct LDL  Breast cancer screening by mammogram -     MM 3D SCREEN BREAST BILATERAL; Future  Medication monitoring encounter -     BASIC METABOLIC PANEL WITH GFR -     Lipid Panel w/reflex Direct LDL -     CBC   Patient provided VS reviewed. BP normotensive. No tachycardia Cont ARB Recheck renal function/fasting labs this week  Follow-up in 3 months or sooner as needed    Follow Up Instructions:    I discussed the assessment and treatment plan with the patient. The patient was provided an opportunity to ask questions and all were answered. The patient agreed with the plan and demonstrated an understanding of the instructions.   The patient was advised to call back or seek an in-person evaluation if the symptoms worsen or if the condition fails to improve as anticipated.  I provided 11-20 minutes of non-face-to-face time during this encounter.   Carlis Stableharley Elizabeth Desiree Valencia, [place]

## 2019-01-06 LAB — CBC
HCT: 41.1 % (ref 35.0–45.0)
Hemoglobin: 14.5 g/dL (ref 11.7–15.5)
MCH: 32.6 pg (ref 27.0–33.0)
MCHC: 35.3 g/dL (ref 32.0–36.0)
MCV: 92.4 fL (ref 80.0–100.0)
MPV: 10.1 fL (ref 7.5–12.5)
Platelets: 303 10*3/uL (ref 140–400)
RBC: 4.45 10*6/uL (ref 3.80–5.10)
RDW: 12.4 % (ref 11.0–15.0)
WBC: 5.3 10*3/uL (ref 3.8–10.8)

## 2019-01-06 LAB — LIPID PANEL W/REFLEX DIRECT LDL
Cholesterol: 179 mg/dL (ref <200)
HDL: 39 mg/dL — ABNORMAL LOW (ref >=50)
LDL Cholesterol (Calc): 117 mg/dL (calc) — ABNORMAL HIGH
Non-HDL Cholesterol (Calc): 140 mg/dL (calc) — ABNORMAL HIGH (ref <130)
Total CHOL/HDL Ratio: 4.6 (calc) (ref <5.0)
Triglycerides: 124 mg/dL (ref <150)

## 2019-01-06 LAB — BASIC METABOLIC PANEL WITH GFR
BUN: 13 mg/dL (ref 7–25)
CO2: 26 mmol/L (ref 20–32)
Calcium: 9.5 mg/dL (ref 8.6–10.4)
Chloride: 105 mmol/L (ref 98–110)
Creat: 0.89 mg/dL (ref 0.50–1.05)
GFR, Est African American: 85 mL/min/{1.73_m2} (ref >=60)
GFR, Est Non African American: 73 mL/min/{1.73_m2} (ref >=60)
Glucose, Bld: 96 mg/dL (ref 65–99)
Potassium: 4.3 mmol/L (ref 3.5–5.3)
Sodium: 140 mmol/L (ref 135–146)

## 2019-01-07 ENCOUNTER — Encounter: Payer: Self-pay | Admitting: Physician Assistant

## 2019-01-07 DIAGNOSIS — N289 Disorder of kidney and ureter, unspecified: Secondary | ICD-10-CM

## 2019-01-07 HISTORY — DX: Disorder of kidney and ureter, unspecified: N28.9

## 2019-01-10 ENCOUNTER — Encounter: Payer: Self-pay | Admitting: Physician Assistant

## 2019-01-13 ENCOUNTER — Encounter: Payer: Self-pay | Admitting: Physician Assistant

## 2019-02-11 ENCOUNTER — Other Ambulatory Visit: Payer: Self-pay

## 2019-02-11 ENCOUNTER — Ambulatory Visit (INDEPENDENT_AMBULATORY_CARE_PROVIDER_SITE_OTHER): Payer: 59

## 2019-02-11 DIAGNOSIS — Z1231 Encounter for screening mammogram for malignant neoplasm of breast: Secondary | ICD-10-CM | POA: Diagnosis not present

## 2019-02-19 ENCOUNTER — Other Ambulatory Visit: Payer: Self-pay | Admitting: Physician Assistant

## 2019-02-19 DIAGNOSIS — R000 Tachycardia, unspecified: Secondary | ICD-10-CM

## 2019-06-01 ENCOUNTER — Other Ambulatory Visit: Payer: Self-pay | Admitting: Physician Assistant

## 2019-06-01 ENCOUNTER — Telehealth: Payer: Self-pay | Admitting: Osteopathic Medicine

## 2019-06-01 DIAGNOSIS — R000 Tachycardia, unspecified: Secondary | ICD-10-CM

## 2019-06-01 NOTE — Telephone Encounter (Signed)
Appointment has been made. Patient wanted to make sure a  Months worth was ordered. Thank you.

## 2019-06-01 NOTE — Telephone Encounter (Signed)
-----   Message from Tasia Catchings, [place] sent at 06/01/2019 11:47 AM EST ----- Can you call this patient and have her make a follow up with a new PCP?  I will send in her refill to the pharmacy.

## 2019-06-01 NOTE — Telephone Encounter (Signed)
Patient is aware and did not have any questions.  

## 2019-06-15 IMAGING — DX DG HAND COMPLETE 3+V*R*
3 series · 3 of 3 positions shown · non-contrast
Comparison: None.

CLINICAL DATA: BILATERAL hand pain for 2 weeks. No known injury.

EXAM:
RIGHT HAND - COMPLETE 3+ VIEW

[hand pa]
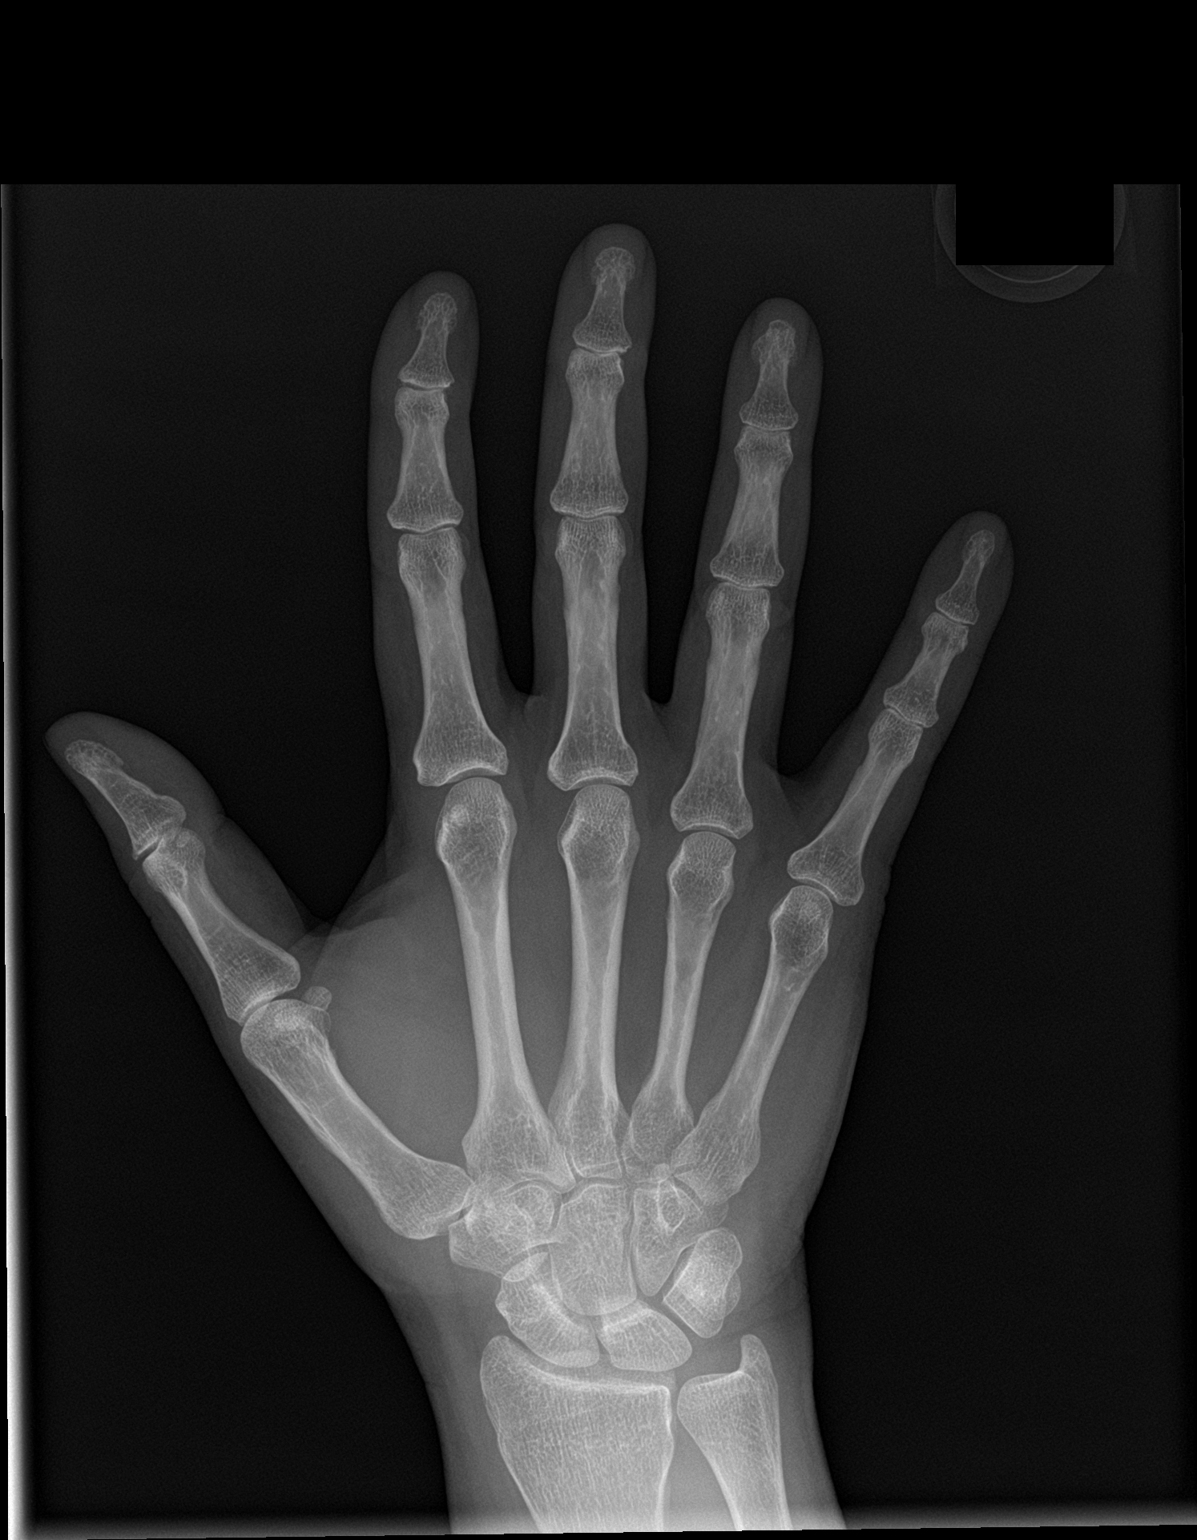

[hand obl]
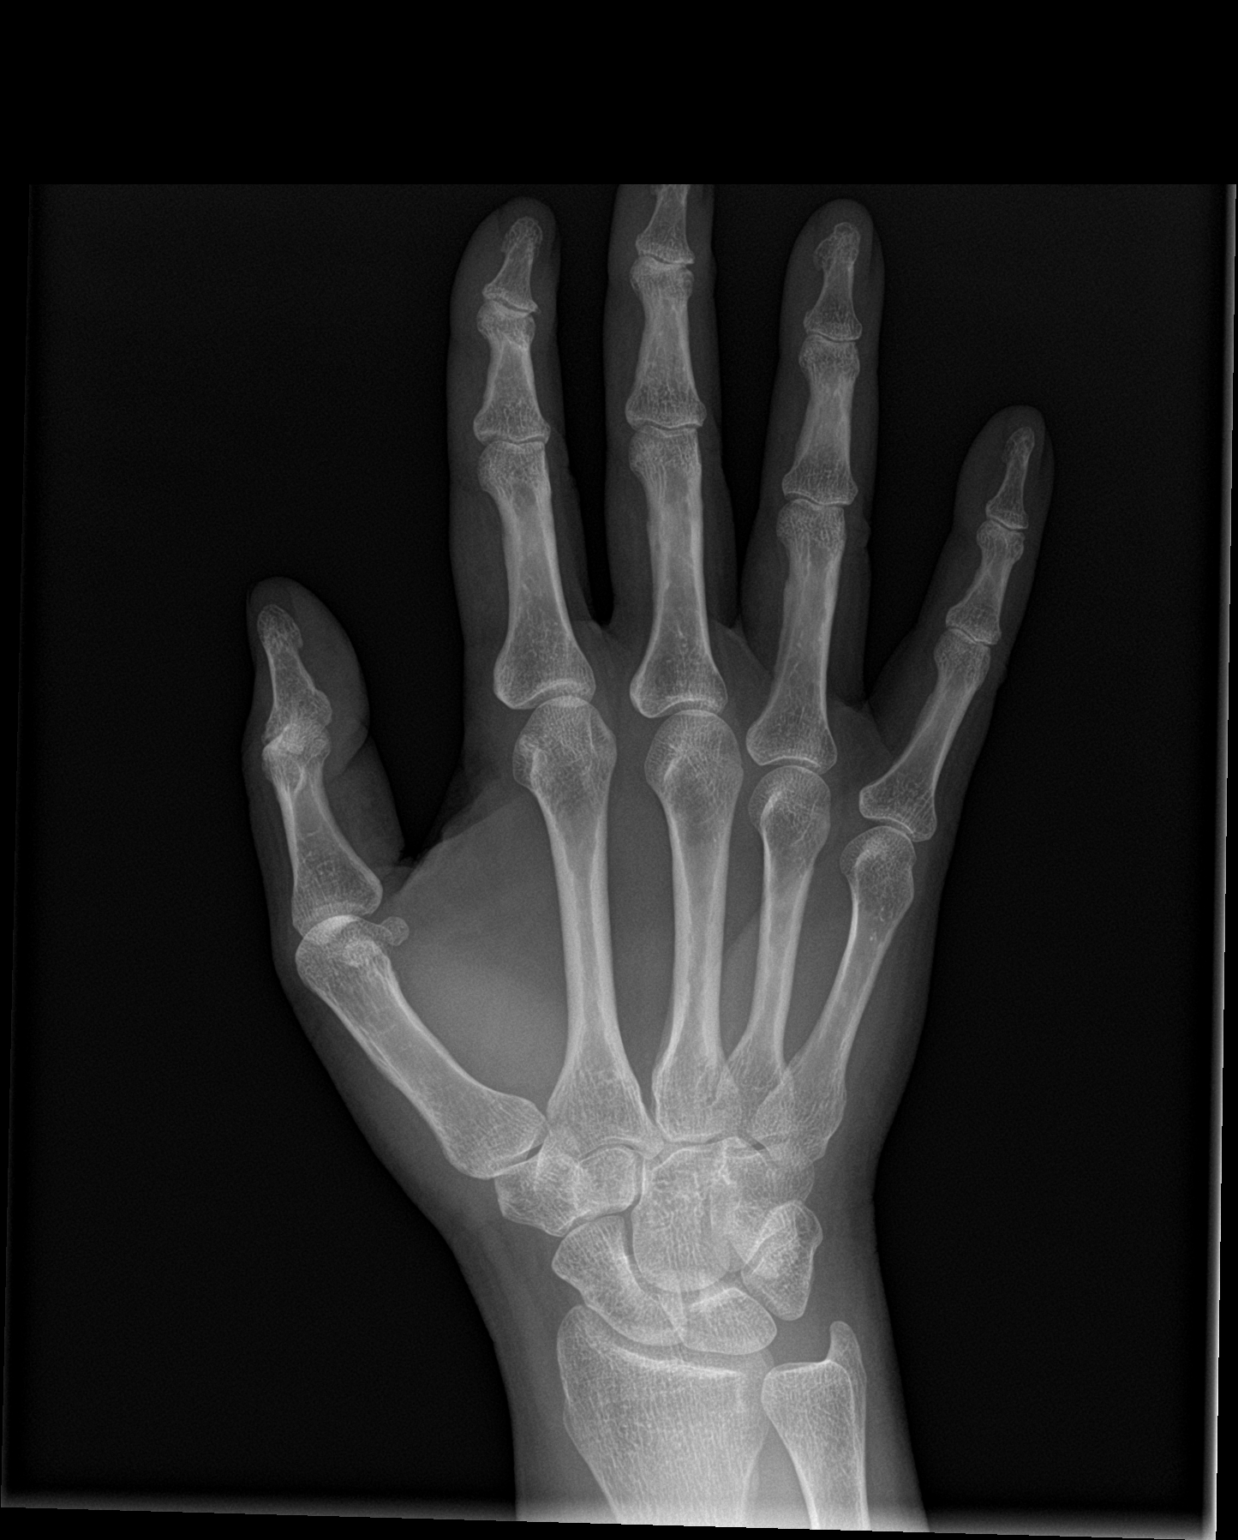

[hand lat]
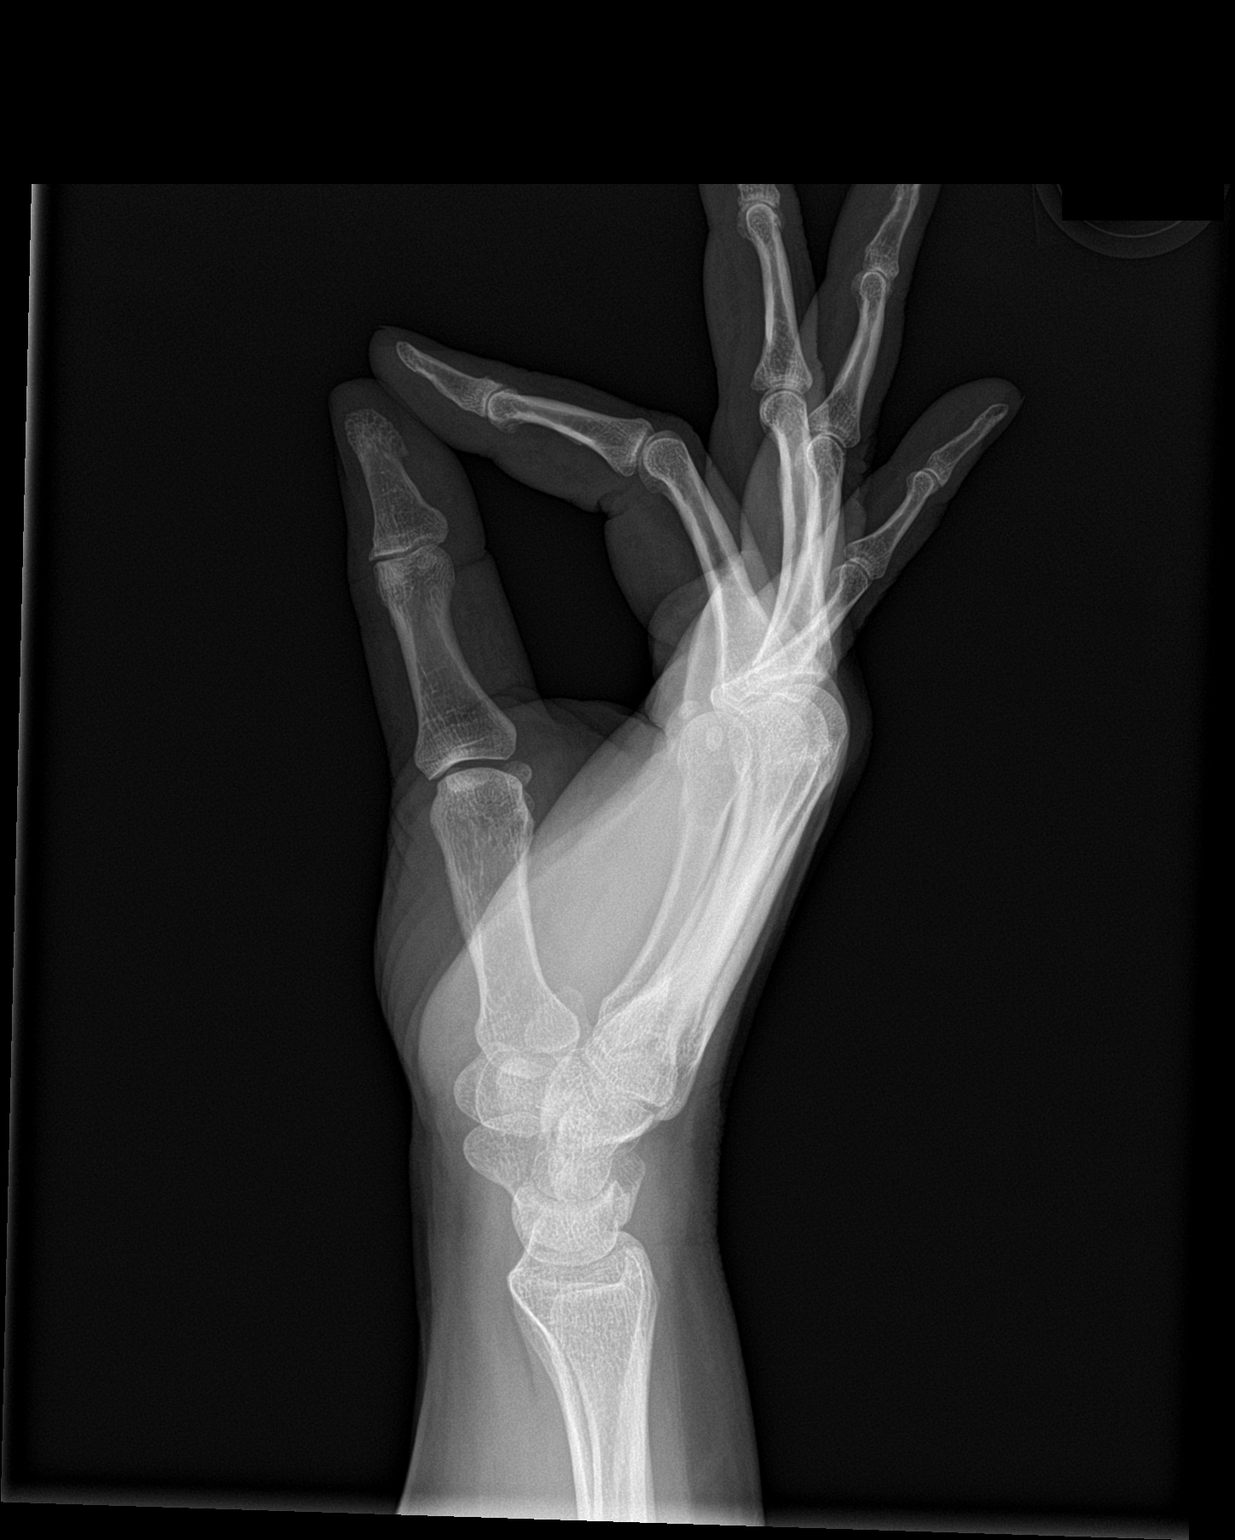

[3 of 3 positions shown; findings below may reference images not displayed]

FINDINGS: There is no evidence of fracture or dislocation. There is no
evidence of arthropathy or other focal bone abnormality. Soft
tissues are unremarkable. Minor degenerative change at the DIP
joints.
IMPRESSION: Negative.

## 2019-06-25 ENCOUNTER — Other Ambulatory Visit: Payer: Self-pay

## 2019-06-25 ENCOUNTER — Ambulatory Visit: Payer: 59 | Admitting: Osteopathic Medicine

## 2019-06-25 ENCOUNTER — Encounter: Payer: Self-pay | Admitting: Osteopathic Medicine

## 2019-06-25 VITALS — BP 134/83 | HR 106 | Temp 98.7°F | Wt 252.1 lb

## 2019-06-25 DIAGNOSIS — Z808 Family history of malignant neoplasm of other organs or systems: Secondary | ICD-10-CM

## 2019-06-25 DIAGNOSIS — I10 Essential (primary) hypertension: Secondary | ICD-10-CM

## 2019-06-25 DIAGNOSIS — Z0000 Encounter for general adult medical examination without abnormal findings: Secondary | ICD-10-CM

## 2019-06-25 DIAGNOSIS — K219 Gastro-esophageal reflux disease without esophagitis: Secondary | ICD-10-CM

## 2019-06-25 DIAGNOSIS — Z832 Family history of diseases of the blood and blood-forming organs and certain disorders involving the immune mechanism: Secondary | ICD-10-CM

## 2019-06-25 DIAGNOSIS — E041 Nontoxic single thyroid nodule: Secondary | ICD-10-CM | POA: Diagnosis not present

## 2019-06-25 DIAGNOSIS — E559 Vitamin D deficiency, unspecified: Secondary | ICD-10-CM

## 2019-06-25 DIAGNOSIS — Z23 Encounter for immunization: Secondary | ICD-10-CM

## 2019-06-25 DIAGNOSIS — N289 Disorder of kidney and ureter, unspecified: Secondary | ICD-10-CM

## 2019-06-25 DIAGNOSIS — R000 Tachycardia, unspecified: Secondary | ICD-10-CM

## 2019-06-25 MED ORDER — OLMESARTAN MEDOXOMIL 40 MG PO TABS: 40.0000 mg | ORAL_TABLET | Freq: Every day | ORAL | 3 refills | Status: DC

## 2019-06-25 MED ORDER — METOPROLOL SUCCINATE ER 25 MG PO TB24: 25.0000 mg | ORAL_TABLET | Freq: Every day | ORAL | 3 refills | Status: DC

## 2019-06-25 NOTE — Progress Notes (Signed)
HPI: Desiree Valencia is a 54 y.o. female who  has a past medical history of Heterozygous factor V Leiden mutation ([place]) (10/21/2017), Hypertension, Multiple thyroid nodules, Osteoarthritis of hand, and Renal insufficiency (01/07/2019).  she presents to [facility] today, 06/25/19,  for chief complaint of: Annual physical     Patient here for annual physical / wellness exam.  See preventive care reviewed as below.  Recent labs reviewed in detail with the patient.   Additional concerns today include:  Allergies a bit worse, wants to know if ok to take Xyzal daily      Past medical, surgical, social and family history reviewed:  Patient Active Problem List   Diagnosis Date Noted  . Vitamin D deficiency 06/25/2019  . Gastroesophageal reflux disease 06/25/2019  . Renal insufficiency 01/07/2019  . Allergic conjunctivitis of both eyes and rhinitis 08/18/2018  . Seasonal allergic rhinitis due to pollen 08/18/2018  . Ganglion cyst of dorsum of right wrist 06/16/2018  . Tachycardia with heart rate 100-120 beats per minute 06/16/2018  . Hypertension goal BP (blood pressure) < 130/80 12/19/2017  . Intertrigo 11/18/2017  . Class 2 obesity due to excess calories without serious comorbidity in adult 10/28/2017  . Heterozygous factor V Leiden mutation ([place]) 10/21/2017  . Thyroid nodule 10/17/2017  . Family history of thyroid cancer 10/17/2017  . Family history of factor V Leiden mutation 10/17/2017  . Osteoarthritis of hand   . Multiple thyroid nodules     Past Surgical History:  Procedure Laterality Date  . ARTHROSCOPIC HAGLUNDS REPAIR Right   . FOOT SURGERY Right     Social History   Tobacco Use  . Smoking status: Never Smoker  . Smokeless tobacco: Never Used  Substance Use Topics  . Alcohol use: Yes    Family History  Problem Relation Age of Onset  . Diabetes Father   . Hypertension Father   . Heart attack Father   . Factor V Leiden  deficiency Father   . Diabetes Sister   . Hyperlipidemia Sister   . Hypertension Sister   . Hypertension Brother   . Cancer Brother   . Hypertension Brother   . Thyroid cancer Brother   . Hypertension Sister   . Factor V Leiden deficiency Sister   . Cancer Mother   . Heart attack Mother      Current medication list and allergy/intolerance information reviewed:    Current Outpatient Medications  Medication Sig Dispense Refill  . acetaminophen (TYLENOL) 650 MG CR tablet Take 1-2 tablets (650-1,300 mg total) by mouth every 8 (eight) hours as needed for pain. 90 tablet 3  . aspirin 81 MG chewable tablet Chew 81 mg by mouth daily.    . Cholecalciferol (VITAMIN D PO) Take by mouth.    . cyclobenzaprine (FLEXERIL) 5 MG tablet Take 5 mg by mouth as needed for muscle spasms.    . diclofenac sodium (VOLTAREN) 1 % GEL Apply 2 g topically 4 (four) times daily. To affected joint. 100 g 11  . levocetirizine (XYZAL ALLERGY 24HR) 5 MG tablet Take 1 tablet (5 mg total) by mouth every evening. 90 tablet 3  . metoprolol succinate (TOPROL-XL) 25 MG 24 hr tablet Take 1 tablet (25 mg total) by mouth daily. 90 tablet 3  . olmesartan (BENICAR) 40 MG tablet Take 1 tablet (40 mg total) by mouth daily. 90 tablet 3  . Olopatadine HCl (PATADAY) 0.2 % SOLN Apply 1 drop to eye daily. 2.5 mL 11  .  omeprazole (PRILOSEC) 20 MG capsule Take 20 mg by mouth daily.     No current facility-administered medications for this visit.    Allergies  Allergen Reactions  . Codeine Shortness Of Breath  . Ace Inhibitors Other (See Comments)    Cough; Rash  . Red Dye Other (See Comments)    Rash      Review of Systems:  Constitutional:  No  fever, no chills, No recent illness  HEENT: No  headache, no vision change, no hearing change, No sore throat, +sinus pressure  Cardiac: No  chest pain, No  pressure, No palpitations,  Respiratory:  No  shortness of breath. No  Cough  Gastrointestinal: No  abdominal pain, No   nausea  Musculoskeletal: No new myalgia/arthralgia  Skin: No  Rash,  Neurologic: No  weakness, No  dizziness  Psychiatric: No  concerns with depression, No  concerns with anxiety, No sleep problems, No mood problems  Exam:  BP 134/83 (BP Location: Left Arm, Patient Position: Sitting, Cuff Size: Large)   Pulse (!) 106   Temp 98.7 F (37.1 C) (Oral)   Wt 252 lb 1.9 oz (114.4 kg)   BMI 40.69 kg/m   Constitutional: VS see above. General Appearance: alert, well-developed, well-nourished, NAD  Eyes: Normal lids and conjunctive, non-icteric sclera  Neck: No masses, trachea midline. No thyroid enlargement. No tenderness/mass appreciated. No lymphadenopathy  Respiratory: Normal respiratory effort. no wheeze, no rhonchi, no rales  Cardiovascular: S1/S2 normal, no murmur, no rub/gallop auscultated. RRR. No lower extremity edema.   Gastrointestinal: Nontender, no masses. No hepatomegaly, no splenomegaly. No hernia appreciated. Bowel sounds normal. Rectal exam deferred.   Musculoskeletal: Gait normal. No clubbing/cyanosis of digits.   Neurological: Normal balance/coordination. No tremor. No cranial nerve deficit on limited exam. Motor and sensation intact and symmetric. Cerebellar reflexes intact.   Skin: warm, dry, intact. No rash/ulcer. No concerning nevi or subq nodules on limited exam.    Psychiatric: Normal judgment/insight. Normal mood and affect. Oriented x3.    No results found for this or any previous visit (from the past 72 hour(s)).  No results found.      ASSESSMENT/PLAN: The primary encounter diagnosis was Annual physical exam. Diagnoses of Hypertension goal BP (blood pressure) < 130/80, Thyroid nodule, Renal insufficiency, Family history of thyroid cancer, Family history of factor V Leiden mutation, Vitamin D deficiency, Gastroesophageal reflux disease, unspecified whether esophagitis present, Need for influenza vaccination, Need for shingles vaccine, and Tachycardia  with heart rate 100-120 beats per minute were also pertinent to this visit.   Orders Placed This Encounter  Procedures  . Varicella-zoster vaccine IM (Shingrix)  . Flu Vaccine QUAD 6+ mos PF IM (Fluarix Quad PF)    Meds ordered this encounter  Medications  . metoprolol succinate (TOPROL-XL) 25 MG 24 hr tablet    Sig: Take 1 tablet (25 mg total) by mouth daily.    Dispense:  90 tablet    Refill:  3  . olmesartan (BENICAR) 40 MG tablet    Sig: Take 1 tablet (40 mg total) by mouth daily.    Dispense:  90 tablet    Refill:  3    Patient Instructions  General Preventive Care  Most recent routine screening lipids/other labs: were good 6 mos ago!   BP goal 130/80 or less  Tobacco: don't!   Alcohol: responsible moderation is ok for most adults - if you have concerns about your alcohol intake, please talk to me!   Exercise: as tolerated to  reduce risk of cardiovascular disease and diabetes. Strength training will also prevent osteoporosis.   Mental health: if need for mental health care (medicines, counseling, other), or concerns about moods, please let me know!   Sexual health: if need for STD testing, or if concerns with libido/pain problems, please let me know!   Advanced Directive: Living Will and/or Healthcare Power of Attorney recommended for all adults, regardless of age or health.  Vaccines  Flu vaccine: recommended for almost everyone, every fall.   Shingles vaccine: Shingrix recommended after age 31.   Pneumonia vaccines: Prevnar and Pneumovax recommended after age 26  Tetanus booster: Tdap recommended every 10 years. Due 2024.  Cancer screenings   Colon cancer screening: colonoscopy due 01/2020  Breast cancer screening: mammogram due 02/2020  Cervical cancer screening: Pap due 11/2022  Lung cancer screening: not needed for non-smokers  Infection screenings . HIV: recommended screening at least once age 27-65, more often as needed. . Gonorrhea/Chlamydia:  screening as needed . Hepatitis C: recommended for anyone born 46-1966 . TB: certain at-risk populations, or depending on work requirements and/or travel history Other . Bone Density Test: recommended for women at age 87        Visit summary with medication list and pertinent instructions was printed for patient to review. All questions at time of visit were answered - patient instructed to contact office with any additional concerns or updates. ER/RTC precautions were reviewed with the patient.    Please note: voice recognition software was used to produce this document, and typos may escape review. Please contact Dr. Lyn Hollingshead for any needed clarifications.     Follow-up plan: Return in about 6 months (around 12/23/2019) for office visit - monitor BP and get 2nd Shingles vaccine .

## 2019-06-25 NOTE — Patient Instructions (Addendum)
General Preventive Care  Most recent routine screening lipids/other labs: were good 6 mos ago!   BP goal 130/80 or less  Tobacco: don't!   Alcohol: responsible moderation is ok for most adults - if you have concerns about your alcohol intake, please talk to me!   Exercise: as tolerated to reduce risk of cardiovascular disease and diabetes. Strength training will also prevent osteoporosis.   Mental health: if need for mental health care (medicines, counseling, other), or concerns about moods, please let me know!   Sexual health: if need for STD testing, or if concerns with libido/pain problems, please let me know!   Advanced Directive: Living Will and/or Healthcare Power of Attorney recommended for all adults, regardless of age or health.  Vaccines  Flu vaccine: recommended for almost everyone, every fall.   Shingles vaccine: Shingrix recommended after age 34.   Pneumonia vaccines: Prevnar and Pneumovax recommended after age 57  Tetanus booster: Tdap recommended every 10 years. Due 2024.  Cancer screenings   Colon cancer screening: colonoscopy due 01/2020  Breast cancer screening: mammogram due 02/2020  Cervical cancer screening: Pap due 11/2022  Lung cancer screening: not needed for non-smokers  Infection screenings . HIV: recommended screening at least once age 88-65, more often as needed. . Gonorrhea/Chlamydia: screening as needed . Hepatitis C: recommended for anyone born 10-1964 . TB: certain at-risk populations, or depending on work requirements and/or travel history Other . Bone Density Test: recommended for women at age 43

## 2019-08-28 ENCOUNTER — Other Ambulatory Visit: Payer: Self-pay | Admitting: Physician Assistant

## 2019-08-28 DIAGNOSIS — J301 Allergic rhinitis due to pollen: Secondary | ICD-10-CM

## 2019-12-15 ENCOUNTER — Ambulatory Visit (INDEPENDENT_AMBULATORY_CARE_PROVIDER_SITE_OTHER): Payer: 59 | Admitting: Physician Assistant

## 2019-12-15 VITALS — Temp 98.1°F

## 2019-12-15 DIAGNOSIS — Z23 Encounter for immunization: Secondary | ICD-10-CM

## 2019-12-15 NOTE — Progress Notes (Signed)
Patient ID: Desiree Valencia, female   DOB: Oct 30, 1965, 54 y.o.   MRN: [id number] Agree with above plan.

## 2019-12-15 NOTE — Progress Notes (Signed)
Established Patient Office Visit  Subjective:  Patient ID: Desiree Valencia, female    DOB: 08/08/1965  Age: 54 y.o. MRN: [id number]  CC:  Chief Complaint  Patient presents with   Immunizations    HPI Desiree Valencia presents for shingles vaccine.   Past Medical History:  Diagnosis Date   Heterozygous factor V Leiden mutation (HCC) 10/21/2017   Hypertension    Multiple thyroid nodules    Osteoarthritis of hand    Renal insufficiency 01/07/2019    Past Surgical History:  Procedure Laterality Date   ARTHROSCOPIC HAGLUNDS REPAIR Right    FOOT SURGERY Right     Family History  Problem Relation Age of Onset   Diabetes Father    Hypertension Father    Heart attack Father    Factor V Leiden deficiency Father    Diabetes Sister    Hyperlipidemia Sister    Hypertension Sister    Hypertension Brother    Cancer Brother    Hypertension Brother    Thyroid cancer Brother    Hypertension Sister    Factor V Leiden deficiency Sister    Cancer Mother    Heart attack Mother     Social History   Socioeconomic History   Marital status: Married    Spouse name: Not on file   Number of children: Not on file   Years of education: Not on file   Highest education level: Not on file  Occupational History   Not on file  Tobacco Use   Smoking status: Never Smoker   Smokeless tobacco: Never Used  Vaping Use   Vaping Use: Never used  Substance and Sexual Activity   Alcohol use: Yes   Drug use: Never   Sexual activity: Yes  Other Topics Concern   Not on file  Social History Narrative   Not on file   Social Determinants of Health   Financial Resource Strain:    Difficulty of Paying Living Expenses:   Food Insecurity:    Worried About Programme researcher, broadcasting/film/video in the Last Year:    Barista in the Last Year:   Transportation Needs:    Freight forwarder (Medical):    Lack of Transportation (Non-Medical):   Physical Activity:     Days of Exercise per Week:    Minutes of Exercise per Session:   Stress:    Feeling of Stress :   Social Connections:    Frequency of Communication with Friends and Family:    Frequency of Social Gatherings with Friends and Family:    Attends Religious Services:    Active Member of Clubs or Organizations:    Attends Engineer, structural:    Marital Status:   Intimate Partner Violence:    Fear of Current or Ex-Partner:    Emotionally Abused:    Physically Abused:    Sexually Abused:     Outpatient Medications Prior to Visit  Medication Sig Dispense Refill   acetaminophen (TYLENOL) 650 MG CR tablet Take 1-2 tablets (650-1,300 mg total) by mouth every 8 (eight) hours as needed for pain. 90 tablet 3   aspirin 81 MG chewable tablet Chew 81 mg by mouth daily.     Cholecalciferol (VITAMIN D PO) Take by mouth.     cyclobenzaprine (FLEXERIL) 5 MG tablet Take 5 mg by mouth as needed for muscle spasms.     diclofenac sodium (VOLTAREN) 1 % GEL Apply 2 g topically 4 (four) times daily. To affected joint.  100 g 11   levocetirizine (XYZAL) 5 MG tablet Take 1 tablet (5 mg total) by mouth every evening. 90 tablet 3   metoprolol succinate (TOPROL-XL) 25 MG 24 hr tablet Take 1 tablet (25 mg total) by mouth daily. 90 tablet 3   olmesartan (BENICAR) 40 MG tablet Take 1 tablet (40 mg total) by mouth daily. 90 tablet 3   Olopatadine HCl (PATADAY) 0.2 % SOLN Apply 1 drop to eye daily. 2.5 mL 11   omeprazole (PRILOSEC) 20 MG capsule Take 20 mg by mouth daily.     No facility-administered medications prior to visit.    Allergies  Allergen Reactions   Codeine Shortness Of Breath   Ace Inhibitors Other (See Comments)    Cough; Rash   Red Dye Other (See Comments)    Rash    ROS Review of Systems    Objective:    Physical Exam  Temp 98.1 F (36.7 C) (Oral)  Wt Readings from Last 3 Encounters:  06/25/19 252 lb 1.9 oz (114.4 kg)  08/18/18 253 lb (114.8 kg)   08/18/18 253 lb (114.8 kg)     Health Maintenance Due  Topic Date Due   Hepatitis C Screening  Never done   HIV Screening  Never done    There are no preventive care reminders to display for this patient.  No results found for: TSH Lab Results  Component Value Date   WBC 5.3 01/05/2019   HGB 14.5 01/05/2019   HCT 41.1 01/05/2019   MCV 92.4 01/05/2019   PLT 303 01/05/2019   Lab Results  Component Value Date   NA 140 01/05/2019   K 4.3 01/05/2019   CO2 26 01/05/2019   GLUCOSE 96 01/05/2019   BUN 13 01/05/2019   CREATININE 0.89 01/05/2019   BILITOT 0.5 06/09/2018   AST 15 06/09/2018   ALT 18 06/09/2018   PROT 7.1 06/09/2018   CALCIUM 9.5 01/05/2019   Lab Results  Component Value Date   CHOL 179 01/05/2019   Lab Results  Component Value Date   HDL 39 (L) 01/05/2019   Lab Results  Component Value Date   LDLCALC 117 (H) 01/05/2019   Lab Results  Component Value Date   TRIG 124 01/05/2019   Lab Results  Component Value Date   CHOLHDL 4.6 01/05/2019   No results found for: HGBA1C    Assessment & Plan:  Shingles vaccine - Patient tolerated injection well without complications.   Problem List Items Addressed This Visit    None    Visit Diagnoses    Need for shingles vaccine    -  Primary   Relevant Orders   Varicella-zoster vaccine IM (Shingrix) (Completed)      No orders of the defined types were placed in this encounter.   Follow-up: Return if symptoms worsen or fail to improve.    Esmond Harps, CMA

## 2020-01-19 ENCOUNTER — Encounter: Payer: Self-pay | Admitting: Osteopathic Medicine

## 2020-01-26 MED ORDER — LOSARTAN POTASSIUM 100 MG PO TABS: 100.0000 mg | ORAL_TABLET | Freq: Every day | ORAL | 3 refills | Status: DC

## 2020-01-26 NOTE — Addendum Note (Signed)
Addended by: Deirdre Pippins on: 01/26/2020 09:47 AM   Modules accepted: Orders

## 2020-03-16 ENCOUNTER — Other Ambulatory Visit: Payer: Self-pay | Admitting: Osteopathic Medicine

## 2020-03-16 DIAGNOSIS — Z1231 Encounter for screening mammogram for malignant neoplasm of breast: Secondary | ICD-10-CM

## 2020-03-31 ENCOUNTER — Ambulatory Visit (INDEPENDENT_AMBULATORY_CARE_PROVIDER_SITE_OTHER): Payer: 59

## 2020-03-31 ENCOUNTER — Other Ambulatory Visit: Payer: Self-pay

## 2020-03-31 DIAGNOSIS — Z1231 Encounter for screening mammogram for malignant neoplasm of breast: Secondary | ICD-10-CM | POA: Diagnosis not present

## 2020-07-13 ENCOUNTER — Other Ambulatory Visit: Payer: Self-pay | Admitting: Osteopathic Medicine

## 2020-07-19 ENCOUNTER — Encounter: Payer: Self-pay | Admitting: Osteopathic Medicine

## 2020-07-20 ENCOUNTER — Telehealth (INDEPENDENT_AMBULATORY_CARE_PROVIDER_SITE_OTHER): Payer: 59 | Admitting: Osteopathic Medicine

## 2020-07-20 ENCOUNTER — Encounter: Payer: Self-pay | Admitting: Osteopathic Medicine

## 2020-07-20 VITALS — Temp 97.9°F | Ht 66.0 in

## 2020-07-20 DIAGNOSIS — U071 COVID-19: Secondary | ICD-10-CM | POA: Diagnosis not present

## 2020-07-20 MED ORDER — BENZONATATE 200 MG PO CAPS: 200.0000 mg | ORAL_CAPSULE | Freq: Three times a day (TID) | ORAL | 0 refills | Status: DC | PRN

## 2020-07-20 MED ORDER — PREDNISONE 20 MG PO TABS: 20.0000 mg | ORAL_TABLET | Freq: Two times a day (BID) | ORAL | 0 refills | Status: DC

## 2020-07-20 MED ORDER — IPRATROPIUM BROMIDE 0.06 % NA SOLN: 2.0000 | Freq: Four times a day (QID) | NASAL | 1 refills | Status: DC

## 2020-07-20 MED ORDER — AMOXICILLIN-POT CLAVULANATE 875-125 MG PO TABS: 1.0000 | ORAL_TABLET | Freq: Two times a day (BID) | ORAL | 0 refills | Status: DC

## 2020-07-20 NOTE — Progress Notes (Signed)
Pt's husband tested positive for Covid on Feb 8th. Pt states she developed symptoms two days after. Pt reports cough and congestion.

## 2020-07-20 NOTE — Progress Notes (Signed)
Telemedicine Visit via  Audio only - telephone (patient preference /  technical difficulty with MyChart video application)  I connected with Desiree Valencia on 07/20/20 at 3:46 PM  by phone or  telemedicine application as noted above  I verified that I am speaking with or regarding  the correct patient using two identifiers.  Participants: Myself, Dr Sunnie Nielsen DO Patient: Desiree Valencia Patient proxy if applicable: none Other, if applicable: none  Patient is at home I am in office at [facility]    I discussed the limitations of evaluation and management  by telemedicine and the availability of in person appointments.  The participant(s) above expressed understanding and  agreed to proceed with this appointment via telemedicine.       History of Present Illness: Desiree Valencia is a 55 y.o. female who would like to discuss COVID symptoms. Developed symptoms 07/14/20, husband tested (+)07/12/20. Feeling very bad sinus congestion, some coughing spells but nothing severe, no SOB, no CP, (+)fatigue     Observations/Objective: Temp 97.9 F (36.6 C) (Oral)   Ht 5\' 6"  (1.676 m)   BMI 40.69 kg/m  BP Readings from Last 3 Encounters:  06/25/19 134/83  12/31/18 113/71  08/18/18 132/83   Exam: Normal Speech.  NAD  Lab and Radiology Results No results found for this or any previous visit (from the past 72 hour(s)). No results found.     Assessment and Plan: 55 y.o. female with The encounter diagnosis was COVID.  Symptomatic care Hold off on abx unless 3 days of steroids/nasal spray and other OTC meds not helping sinus congestion   PDMP not reviewed this encounter. No orders of the defined types were placed in this encounter.  Meds ordered this encounter  Medications  . predniSONE (DELTASONE) 20 MG tablet    Sig: Take 1 tablet (20 mg total) by mouth 2 (two) times daily with a meal.    Dispense:  10 tablet    Refill:  0  . ipratropium (ATROVENT)  0.06 % nasal spray    Sig: Place 2 sprays into both nostrils 4 (four) times daily. As needed for runny nose / postnasal drip    Dispense:  15 mL    Refill:  1  . benzonatate (TESSALON) 200 MG capsule    Sig: Take 1 capsule (200 mg total) by mouth 3 (three) times daily as needed for cough.    Dispense:  45 capsule    Refill:  0  . amoxicillin-clavulanate (AUGMENTIN) 875-125 MG tablet    Sig: Take 1 tablet by mouth 2 (two) times daily.    Dispense:  14 tablet    Refill:  0   Patient Instructions  Medications & Home Remedies for Upper Respiratory Illness   Note: the following list assumes no pregnancy, normal liver & kidney function and no other drug interactions. Dr. 53 has highlighted medications which are safe for you to use, but these may not be appropriate for everyone. Always ask a pharmacist or qualified medical provider if you have any questions!    Aches/Pains, Fever, Headache OTC Acetaminophen (Tylenol) 500 mg tablets - take max 2 tablets (1000 mg) every 6 hours (4 times per day)  OTC Ibuprofen (Motrin) 200 mg tablets - take max 4 tablets (800 mg) every 6 hours*   Sinus Congestion Prescription Atrovent as directed OTC Nasal Saline if desired to rinse OTC Oxymetolazone (Afrin, others) sparing use due to rebound congestion, NEVER use in kids OTC Phenylephrine (Sudafed) 10 mg tablets every  4 hours (or the 12-hour formulation)* OTC Diphenhydramine (Benadryl) 25 mg tablets - take max 2 tablets every 4 hours   Cough & Sore Throat Prescription cough pills or syrups as directed OTC Dextromethorphan (Robitussin, others) - cough suppressant OTC Guaifenesin (Robitussin, Mucinex, others) - expectorant (helps cough up mucus) (Dextromethorphan and Guaifenesin also come in a combination tablet/syrup) OTC Lozenges w/ Benzocaine + Menthol (Cepacol) Honey - as much as you want! Teas which "coat the throat" - look for ingredients Elm Bark, Licorice Root, Marshmallow Root    Other Prescription Oral Steroids to decrease inflammation and improve energy Prescription Antibiotics if these are necessary for bacterial infection - take ALL, even if you're feeling better  OTC Zinc Lozenges within 24 hours of symptoms onset - mixed evidence this shortens the duration of the common cold Don't waste your money on Vitamin C or Echinacea in acute illness - it's already too late!    *Caution in patients with high blood pressure      Instructions sent via MyChart.   Follow Up Instructions: Return if symptoms worsen or fail to improve.    I discussed the assessment and treatment plan with the patient. The patient was provided an opportunity to ask questions and all were answered. The patient agreed with the plan and demonstrated an understanding of the instructions.   The patient was advised to call back or seek an in-person evaluation if any new concerns, if symptoms worsen or if the condition fails to improve as anticipated.  21 minutes of non-face-to-face time was provided during this encounter.      . . . . . . . . . . . . . Marland Kitchen                   Historical information moved to improve visibility of documentation.  Past Medical History:  Diagnosis Date  . Heterozygous factor V Leiden mutation (HCC) 10/21/2017  . Hypertension   . Multiple thyroid nodules   . Osteoarthritis of hand   . Renal insufficiency 01/07/2019   Past Surgical History:  Procedure Laterality Date  . ARTHROSCOPIC HAGLUNDS REPAIR Right   . FOOT SURGERY Right    Social History   Tobacco Use  . Smoking status: Never Smoker  . Smokeless tobacco: Never Used  Substance Use Topics  . Alcohol use: Yes   family history includes Cancer in her brother and mother; Diabetes in her father and sister; Factor V Leiden deficiency in her father and sister; Heart attack in her father and mother; Hyperlipidemia in her sister; Hypertension in her brother, brother, father,  sister, and sister; Thyroid cancer in her brother.  Medications: Current Outpatient Medications  Medication Sig Dispense Refill  . amoxicillin-clavulanate (AUGMENTIN) 875-125 MG tablet Take 1 tablet by mouth 2 (two) times daily. 14 tablet 0  . benzonatate (TESSALON) 200 MG capsule Take 1 capsule (200 mg total) by mouth 3 (three) times daily as needed for cough. 45 capsule 0  . ipratropium (ATROVENT) 0.06 % nasal spray Place 2 sprays into both nostrils 4 (four) times daily. As needed for runny nose / postnasal drip 15 mL 1  . predniSONE (DELTASONE) 20 MG tablet Take 1 tablet (20 mg total) by mouth 2 (two) times daily with a meal. 10 tablet 0  . acetaminophen (TYLENOL) 650 MG CR tablet Take 1-2 tablets (650-1,300 mg total) by mouth every 8 (eight) hours as needed for pain. 90 tablet 3  . aspirin 81 MG chewable tablet Chew 81 mg  by mouth daily.    . Cholecalciferol (VITAMIN D PO) Take by mouth.    . cyclobenzaprine (FLEXERIL) 5 MG tablet Take 5 mg by mouth as needed for muscle spasms.    . diclofenac sodium (VOLTAREN) 1 % GEL Apply 2 g topically 4 (four) times daily. To affected joint. 100 g 11  . levocetirizine (XYZAL) 5 MG tablet Take 1 tablet (5 mg total) by mouth every evening. 90 tablet 3  . losartan (COZAAR) 100 MG tablet Take 1 tablet (100 mg total) by mouth daily. 90 tablet 0  . metoprolol succinate (TOPROL-XL) 25 MG 24 hr tablet Take 1 tablet (25 mg total) by mouth daily. 90 tablet 0  . Olopatadine HCl (PATADAY) 0.2 % SOLN Apply 1 drop to eye daily. 2.5 mL 11  . omeprazole (PRILOSEC) 20 MG capsule Take 20 mg by mouth daily.     No current facility-administered medications for this visit.   Allergies  Allergen Reactions  . Codeine Shortness Of Breath and Anaphylaxis  . Ace Inhibitors Other (See Comments)    Cough; Rash Cough; Rash Cough; Rash  . Red Dye Other (See Comments)    Rash Rash Rash     If phone visit, billing and coding can please add appropriate modifier if needed

## 2020-07-20 NOTE — Patient Instructions (Signed)
Medications & Home Remedies for Upper Respiratory Illness   Note: the following list assumes no pregnancy, normal liver & kidney function and no other drug interactions. Dr. Tay Whitwell has highlighted medications which are safe for you to use, but these may not be appropriate for everyone. Always ask a pharmacist or qualified medical provider if you have any questions!    Aches/Pains, Fever, Headache OTC Acetaminophen (Tylenol) 500 mg tablets - take max 2 tablets (1000 mg) every 6 hours (4 times per day)  OTC Ibuprofen (Motrin) 200 mg tablets - take max 4 tablets (800 mg) every 6 hours*   Sinus Congestion Prescription Atrovent as directed OTC Nasal Saline if desired to rinse OTC Oxymetolazone (Afrin, others) sparing use due to rebound congestion, NEVER use in kids OTC Phenylephrine (Sudafed) 10 mg tablets every 4 hours (or the 12-hour formulation)* OTC Diphenhydramine (Benadryl) 25 mg tablets - take max 2 tablets every 4 hours   Cough & Sore Throat Prescription cough pills or syrups as directed OTC Dextromethorphan (Robitussin, others) - cough suppressant OTC Guaifenesin (Robitussin, Mucinex, others) - expectorant (helps cough up mucus) (Dextromethorphan and Guaifenesin also come in a combination tablet/syrup) OTC Lozenges w/ Benzocaine + Menthol (Cepacol) Honey - as much as you want! Teas which "coat the throat" - look for ingredients Elm Bark, Licorice Root, Marshmallow Root   Other Prescription Oral Steroids to decrease inflammation and improve energy Prescription Antibiotics if these are necessary for bacterial infection - take ALL, even if you're feeling better  OTC Zinc Lozenges within 24 hours of symptoms onset - mixed evidence this shortens the duration of the common cold Don't waste your money on Vitamin C or Echinacea in acute illness - it's already too late!    *Caution in patients with high blood pressure   

## 2020-08-10 ENCOUNTER — Ambulatory Visit: Payer: 59 | Admitting: Osteopathic Medicine

## 2020-08-10 ENCOUNTER — Encounter: Payer: Self-pay | Admitting: Osteopathic Medicine

## 2020-08-10 ENCOUNTER — Other Ambulatory Visit: Payer: Self-pay

## 2020-08-10 VITALS — BP 138/84 | HR 82 | Temp 98.5°F | Wt 254.1 lb

## 2020-08-10 DIAGNOSIS — I10 Essential (primary) hypertension: Secondary | ICD-10-CM | POA: Diagnosis not present

## 2020-08-10 DIAGNOSIS — Z0000 Encounter for general adult medical examination without abnormal findings: Secondary | ICD-10-CM | POA: Diagnosis not present

## 2020-08-10 DIAGNOSIS — Z860100 Personal history of colon polyps, unspecified: Secondary | ICD-10-CM

## 2020-08-10 DIAGNOSIS — E041 Nontoxic single thyroid nodule: Secondary | ICD-10-CM | POA: Diagnosis not present

## 2020-08-10 DIAGNOSIS — E559 Vitamin D deficiency, unspecified: Secondary | ICD-10-CM | POA: Diagnosis not present

## 2020-08-10 DIAGNOSIS — Z808 Family history of malignant neoplasm of other organs or systems: Secondary | ICD-10-CM

## 2020-08-10 NOTE — Patient Instructions (Addendum)
General Preventive Care  Most recent routine screening labs: ordered today.   Blood pressure goal 130/80 or less, Rx adjustment needed for BP>140/90.   Tobacco: don't!     Alcohol: responsible moderation is ok for most adults - if you have concerns about your alcohol intake, please talk to me!   Exercise: as tolerated to reduce risk of cardiovascular disease and diabetes. Strength training will also prevent osteoporosis.   Mental health: if need for mental health care (medicines, counseling, other), or concerns about moods, please let me know!   Sexual / Reproductive health: if need for STD testing, or if concerns with libido/pain problems, please let me know!   Advanced Directive: Living Will and/or Healthcare Power of Attorney recommended for all adults, regardless of age or health.  Vaccines  Flu vaccine: every fall/winter  Shingles vaccine: all done!   Pneumonia vaccines: after age 41  Tetanus booster: every 10 years - due 2024  COVID vaccine: THANKS for getting your vaccine! Booster strongly advised! Cancer screenings   Colon cancer screening: done 02/2020, repeat colonoscopy per GI 02/2023  Breast cancer screening: mammogram due 03/2021: call [phone]  Cervical cancer screening: Pap due 11/2022  Lung cancer screening: not needed for non-smokers  Infection screenings  . HIV: recommended screening at least once age 65-65 . Gonorrhea/Chlamydia, other STI: screening as needed . Hepatitis C: recommended once for everyone age 62-75 . TB: at-risk populations, or depending on work requirements and/or travel history Other . Bone Density Test: recommended for women at age 38

## 2020-08-10 NOTE — Progress Notes (Signed)
Desiree Valencia is a 55 y.o. female who presents to  [facility] at [facility]  today, 08/10/20, seeking care for the following:  . Annual physical      ASSESSMENT & PLAN with other pertinent findings:  The primary encounter diagnosis was Annual physical exam. Diagnoses of Hypertension goal BP (blood pressure) < 130/80, Vitamin D deficiency, Thyroid nodule, Family history of thyroid cancer, and History of colon polyps were also pertinent to this visit.    Patient Instructions  General Preventive Care  Most recent routine screening labs: ordered today.   Blood pressure goal 130/80 or less, Rx adjustment needed for BP>140/90.   Tobacco: don't!     Alcohol: responsible moderation is ok for most adults - if you have concerns about your alcohol intake, please talk to me!   Exercise: as tolerated to reduce risk of cardiovascular disease and diabetes. Strength training will also prevent osteoporosis.   Mental health: if need for mental health care (medicines, counseling, other), or concerns about moods, please let me know!   Sexual / Reproductive health: if need for STD testing, or if concerns with libido/pain problems, please let me know!   Advanced Directive: Living Will and/or Healthcare Power of Attorney recommended for all adults, regardless of age or health.  Vaccines  Flu vaccine: every fall/winter  Shingles vaccine: all done!   Pneumonia vaccines: after age 52  Tetanus booster: every 10 years - due 2024  COVID vaccine: THANKS for getting your vaccine! Booster strongly advised! Cancer screenings   Colon cancer screening: done 02/2020, repeat colonoscopy per GI 02/2023  Breast cancer screening: mammogram due 03/2021: call [phone]  Cervical cancer screening: Pap due 11/2022  Lung cancer screening: not needed for non-smokers  Infection screenings  . HIV: recommended screening at least once age  49-65 . Gonorrhea/Chlamydia, other STI: screening as needed . Hepatitis C: recommended once for everyone age 2-75 . TB: at-risk populations, or depending on work requirements and/or travel history Other . Bone Density Test: recommended for women at age 58   Orders Placed This Encounter  Procedures  . CBC  . COMPLETE METABOLIC PANEL WITH GFR  . Lipid panel  . TSH  . VITAMIN D 25 Hydroxy (Vit-D Deficiency, Fractures)    No orders of the defined types were placed in this encounter.    See below for relevant physical exam findings  See below for recent lab and imaging results reviewed  Medications, allergies, PMH, PSH, SocH, FamH reviewed below    Follow-up instructions: Return in about 1 year (around 08/10/2021) for [place] (call 1-2 weeks prior to visit for lab orders).                                        Exam:  BP 138/84 (BP Location: Left Arm, Patient Position: Sitting, Cuff Size: Large)   Pulse 82   Temp 98.5 F (36.9 C) (Oral)   Wt 254 lb 1.9 oz (115.3 kg)   BMI 41.02 kg/m   Constitutional: VS see above. General Appearance: alert, well-developed, well-nourished, NAD  Neck: No masses, trachea midline.   Respiratory: Normal respiratory effort. no wheeze, no rhonchi, no rales  Cardiovascular: S1/S2 normal, no murmur, no rub/gallop auscultated. RRR.   Musculoskeletal: Gait normal. Symmetric and independent movement of all extremities  Abdominal: non-tender, non-distended, no appreciable organomegaly, neg Murphy's, BS [id number]  Neurological: Normal balance/coordination.  No tremor.  Skin: warm, dry, intact.   Psychiatric: Normal judgment/insight. Normal mood and affect. Oriented x3.   Current Meds  Medication Sig  . acetaminophen (TYLENOL) 650 MG CR tablet Take 1-2 tablets (650-1,300 mg total) by mouth every 8 (eight) hours as needed for pain.  Marland Kitchen aspirin 81 MG chewable tablet Chew 81 mg by mouth daily.  . benzonatate  (TESSALON) 200 MG capsule Take 1 capsule (200 mg total) by mouth 3 (three) times daily as needed for cough.  . Cholecalciferol (VITAMIN D PO) Take by mouth.  . cyclobenzaprine (FLEXERIL) 5 MG tablet Take 5 mg by mouth as needed for muscle spasms.  . diclofenac sodium (VOLTAREN) 1 % GEL Apply 2 g topically 4 (four) times daily. To affected joint.  Marland Kitchen ipratropium (ATROVENT) 0.06 % nasal spray Place 2 sprays into both nostrils 4 (four) times daily. As needed for runny nose / postnasal drip  . levocetirizine (XYZAL) 5 MG tablet Take 1 tablet (5 mg total) by mouth every evening.  Marland Kitchen losartan (COZAAR) 100 MG tablet Take 1 tablet (100 mg total) by mouth daily.  . metoprolol succinate (TOPROL-XL) 25 MG 24 hr tablet Take 1 tablet (25 mg total) by mouth daily.  . Olopatadine HCl (PATADAY) 0.2 % SOLN Apply 1 drop to eye daily.  Marland Kitchen omeprazole (PRILOSEC) 20 MG capsule Take 20 mg by mouth daily.    Allergies  Allergen Reactions  . Codeine Shortness Of Breath and Anaphylaxis  . Ace Inhibitors Other (See Comments)    Cough; Rash   . Red Dye Other (See Comments)    Rash     Patient Active Problem List   Diagnosis Date Noted  . Vitamin D deficiency 06/25/2019  . Gastroesophageal reflux disease 06/25/2019  . Renal insufficiency 01/07/2019  . Allergic conjunctivitis of both eyes and rhinitis 08/18/2018  . Seasonal allergic rhinitis due to pollen 08/18/2018  . Ganglion cyst of dorsum of right wrist 06/16/2018  . Tachycardia with heart rate 100-120 beats per minute 06/16/2018  . Hypertension goal BP (blood pressure) < 130/80 12/19/2017  . Intertrigo 11/18/2017  . Class 2 obesity due to excess calories without serious comorbidity in adult 10/28/2017  . Heterozygous factor V Leiden mutation (HCC) 10/21/2017  . Thyroid nodule 10/17/2017  . Family history of thyroid cancer 10/17/2017  . Family history of factor V Leiden mutation 10/17/2017  . Osteoarthritis of hand   . Multiple thyroid nodules      Family History  Problem Relation Age of Onset  . Diabetes Father   . Hypertension Father   . Heart attack Father   . Factor V Leiden deficiency Father   . Diabetes Sister   . Hyperlipidemia Sister   . Hypertension Sister   . Hypertension Brother   . Cancer Brother   . Hypertension Brother   . Thyroid cancer Brother   . Hypertension Sister   . Factor V Leiden deficiency Sister   . Cancer Mother   . Heart attack Mother     Social History   Tobacco Use  Smoking Status Never Smoker  Smokeless Tobacco Never Used    Past Surgical History:  Procedure Laterality Date  . ARTHROSCOPIC HAGLUNDS REPAIR Right   . FOOT SURGERY Right     Immunization History  Administered Date(s) Administered  . Influenza,inj,Quad PF,6+ Mos 06/25/2019  . Moderna Sars-Covid-2 Vaccination 10/09/2019, 11/06/2019  . Tdap 08/07/2012  . Zoster Recombinat (Shingrix) 06/25/2019, 12/15/2019    No results found for this or any previous  visit (from the past 2160 hour(s)).  No results found.     All questions at time of visit were answered - patient instructed to contact office with any additional concerns or updates. ER/RTC precautions were reviewed with the patient as applicable.   Please note: manual typing as well as voice recognition software may have been used to produce this document - typos may escape review. Please contact Dr. Lyn Hollingshead for any needed clarifications.

## 2020-08-13 LAB — COMPLETE METABOLIC PANEL WITH GFR
AG Ratio: 1.9 (calc) (ref 1.0–2.5)
ALT: 35 U/L — ABNORMAL HIGH (ref 6–29)
AST: 29 U/L (ref 10–35)
Albumin: 4.6 g/dL (ref 3.6–5.1)
Alkaline phosphatase (APISO): 54 U/L (ref 37–153)
BUN: 14 mg/dL (ref 7–25)
CO2: 22 mmol/L (ref 20–32)
Calcium: 9.7 mg/dL (ref 8.6–10.4)
Chloride: 108 mmol/L (ref 98–110)
Creat: 0.87 mg/dL (ref 0.50–1.05)
GFR, Est African American: 86 mL/min/{1.73_m2} (ref >=60)
GFR, Est Non African American: 74 mL/min/{1.73_m2} (ref >=60)
Globulin: 2.4 g/dL (calc) (ref 1.9–3.7)
Glucose, Bld: 102 mg/dL — ABNORMAL HIGH (ref 65–99)
Potassium: 4.2 mmol/L (ref 3.5–5.3)
Sodium: 140 mmol/L (ref 135–146)
Total Bilirubin: 0.8 mg/dL (ref 0.2–1.2)
Total Protein: 7 g/dL (ref 6.1–8.1)

## 2020-08-13 LAB — TSH: TSH: 0.86 mIU/L (ref 0.40–4.50)

## 2020-08-13 LAB — LIPID PANEL
Cholesterol: 201 mg/dL — ABNORMAL HIGH (ref <200)
HDL: 50 mg/dL (ref >=50)
LDL Cholesterol (Calc): 127 mg/dL (calc) — ABNORMAL HIGH
Non-HDL Cholesterol (Calc): 151 mg/dL (calc) — ABNORMAL HIGH (ref <130)
Total CHOL/HDL Ratio: 4 (calc) (ref <5.0)
Triglycerides: 126 mg/dL (ref <150)

## 2020-08-13 LAB — CBC
HCT: 44.7 % (ref 35.0–45.0)
Hemoglobin: 15.4 g/dL (ref 11.7–15.5)
MCH: 32.1 pg (ref 27.0–33.0)
MCHC: 34.5 g/dL (ref 32.0–36.0)
MCV: 93.1 fL (ref 80.0–100.0)
MPV: 9.9 fL (ref 7.5–12.5)
Platelets: 266 10*3/uL (ref 140–400)
RBC: 4.8 10*6/uL (ref 3.80–5.10)
RDW: 12.9 % (ref 11.0–15.0)
WBC: 5.4 10*3/uL (ref 3.8–10.8)

## 2020-08-13 LAB — VITAMIN D 25 HYDROXY (VIT D DEFICIENCY, FRACTURES): Vit D, 25-Hydroxy: 67 ng/mL (ref 30–100)

## 2020-08-13 LAB — HEMOGLOBIN A1C W/OUT EAG: Hgb A1c MFr Bld: 5.2 % of total Hgb (ref <5.7)

## 2020-10-08 ENCOUNTER — Other Ambulatory Visit: Payer: Self-pay | Admitting: Osteopathic Medicine

## 2020-11-23 ENCOUNTER — Other Ambulatory Visit: Payer: Self-pay

## 2020-11-23 ENCOUNTER — Ambulatory Visit: Payer: 59 | Admitting: Osteopathic Medicine

## 2020-11-23 VITALS — BP 139/82 | HR 80 | Temp 97.8°F | Wt 255.0 lb

## 2020-11-23 DIAGNOSIS — M542 Cervicalgia: Secondary | ICD-10-CM | POA: Diagnosis not present

## 2020-11-23 DIAGNOSIS — Z8349 Family history of other endocrine, nutritional and metabolic diseases: Secondary | ICD-10-CM | POA: Diagnosis not present

## 2020-11-23 NOTE — Progress Notes (Signed)
Desiree Valencia is a 55 y.o. female who presents to  [facility] at [facility]  today, 11/23/20, seeking care for the following:  Concern for neck swelling on L side. Tender to touch, no obvious mass, no limitation to range of motion in flexion/extension, sidebending, rotation. No skin changes. No dysphagia. No headache, no arm pain. Normal exam.      ASSESSMENT & PLAN with other pertinent findings:  The primary encounter diagnosis was Neck pain on left side. A diagnosis of Family history of thyroid disease was also pertinent to this visit.    Patient Instructions  Plan: If labs are ok and symptoms improve w/ Flexeril, nothing else to do! If pain persists or changes or gets worse, and/or if labs abnormal, will pursue imaging and ENT consult.   Orders Placed This Encounter  Procedures   CBC with Differential/Platelet   TSH(Reflex)   COMPLETE METABOLIC PANEL WITH GFR    No orders of the defined types were placed in this encounter.    See below for relevant physical exam findings  See below for recent lab and imaging results reviewed  Medications, allergies, PMH, PSH, SocH, FamH reviewed below    Follow-up instructions: Return for RECHECK PENDING RESULTS / IF WORSE OR CHANGE.                                        Exam:  BP 139/82 (BP Location: Left Arm, Patient Position: Sitting, Cuff Size: Large)   Pulse 80   Temp 97.8 F (36.6 C) (Oral)   Wt 255 lb (115.7 kg)   BMI 41.16 kg/m  Constitutional: VS see above. General Appearance: alert, well-developed, well-nourished, NAD Neck: No masses, trachea midline.  Respiratory: Normal respiratory effort. no wheeze, no rhonchi, no rales Cardiovascular: S1/S2 normal, no murmur, no rub/gallop auscultated. RRR.  Musculoskeletal: Gait normal. Symmetric and independent movement of all extremities Neurological: Normal balance/coordination. No tremor. Skin:  warm, dry, intact.  Psychiatric: Normal judgment/insight. Normal mood and affect. Oriented x3.   Current Meds  Medication Sig   acetaminophen (TYLENOL) 650 MG CR tablet Take 1-2 tablets (650-1,300 mg total) by mouth every 8 (eight) hours as needed for pain.   aspirin 81 MG chewable tablet Chew 81 mg by mouth daily.   Cholecalciferol (VITAMIN D PO) Take by mouth.   cyclobenzaprine (FLEXERIL) 5 MG tablet Take 5 mg by mouth as needed for muscle spasms.   diclofenac sodium (VOLTAREN) 1 % GEL Apply 2 g topically 4 (four) times daily. To affected joint.   ipratropium (ATROVENT) 0.06 % nasal spray Place 2 sprays into both nostrils 4 (four) times daily. As needed for runny nose / postnasal drip   levocetirizine (XYZAL) 5 MG tablet Take 1 tablet (5 mg total) by mouth every evening.   losartan (COZAAR) 100 MG tablet Take 1 tablet (100 mg total) by mouth daily. (NEEDS APPOINTMENT BEFORE MORE REFILLS)   metoprolol succinate (TOPROL-XL) 25 MG 24 hr tablet Take 1 tablet (25 mg total) by mouth daily. (NEEDS APPOINTMENT BEFORE MORE REFILLS)   Olopatadine HCl (PATADAY) 0.2 % SOLN Apply 1 drop to eye daily.   omeprazole (PRILOSEC) 20 MG capsule Take 20 mg by mouth daily.    Allergies  Allergen Reactions   Codeine Shortness Of Breath and Anaphylaxis   Ace Inhibitors Other (See Comments)    Cough; Rash    Red  Dye Other (See Comments)    Rash     Patient Active Problem List   Diagnosis Date Noted   Vitamin D deficiency 06/25/2019   Gastroesophageal reflux disease 06/25/2019   Renal insufficiency 01/07/2019   Allergic conjunctivitis of both eyes and rhinitis 08/18/2018   Seasonal allergic rhinitis due to pollen 08/18/2018   Ganglion cyst of dorsum of right wrist 06/16/2018   Tachycardia with heart rate 100-120 beats per minute 06/16/2018   Hypertension goal BP (blood pressure) < 130/80 12/19/2017   Intertrigo 11/18/2017   Class 2 obesity due to excess calories without serious comorbidity in adult  10/28/2017   Heterozygous factor V Leiden mutation (HCC) 10/21/2017   Thyroid nodule 10/17/2017   Family history of thyroid cancer 10/17/2017   Family history of factor V Leiden mutation 10/17/2017   Osteoarthritis of hand    Multiple thyroid nodules     Family History  Problem Relation Age of Onset   Diabetes Father    Hypertension Father    Heart attack Father    Factor V Leiden deficiency Father    Diabetes Sister    Hyperlipidemia Sister    Hypertension Sister    Hypertension Brother    Cancer Brother    Hypertension Brother    Thyroid cancer Brother    Hypertension Sister    Factor V Leiden deficiency Sister    Cancer Mother    Heart attack Mother     Social History   Tobacco Use  Smoking Status Never  Smokeless Tobacco Never    Past Surgical History:  Procedure Laterality Date   ARTHROSCOPIC HAGLUNDS REPAIR Right    FOOT SURGERY Right     Immunization History  Administered Date(s) Administered   Influenza,inj,Quad PF,6+ Mos 06/25/2019   Moderna Sars-Covid-2 Vaccination 10/09/2019, 11/06/2019   Tdap 08/07/2012   Zoster Recombinat (Shingrix) 06/25/2019, 12/15/2019    Recent Results (from the past 2160 hour(s))  CBC with Differential/Platelet     Status: Abnormal   Collection Time: 11/23/20 12:00 AM  Result Value Ref Range   WBC 5.8 3.8 - 10.8 Thousand/uL   RBC 4.93 3.80 - 5.10 Million/uL   Hemoglobin 15.7 (H) 11.7 - 15.5 g/dL   HCT 74.0 (H) 81.4 - 48.1 %   MCV 94.1 80.0 - 100.0 fL   MCH 31.8 27.0 - 33.0 pg   MCHC 33.8 32.0 - 36.0 g/dL   RDW 85.6 31.4 - 97.0 %   Platelets 288 140 - 400 Thousand/uL   MPV 10.3 7.5 - 12.5 fL   Neutro Abs 3,312 1,500 - 7,800 cells/uL   Lymphs Abs 1,757 850 - 3,900 cells/uL   Absolute Monocytes 487 200 - 950 cells/uL   Eosinophils Absolute 191 15 - 500 cells/uL   Basophils Absolute 52 0 - 200 cells/uL   Neutrophils Relative % 57.1 %   Total Lymphocyte 30.3 %   Monocytes Relative 8.4 %   Eosinophils Relative 3.3 %    Basophils Relative 0.9 %    No results found.     All questions at time of visit were answered - patient instructed to contact office with any additional concerns or updates. ER/RTC precautions were reviewed with the patient as applicable.   Please note: manual typing as well as voice recognition software may have been used to produce this document - typos may escape review. Please contact Dr. Lyn Hollingshead for any needed clarifications.

## 2020-11-23 NOTE — Patient Instructions (Signed)
Plan: If labs are ok and symptoms improve w/ Flexeril, nothing else to do! If pain persists or changes or gets worse, and/or if labs abnormal, will pursue imaging and ENT consult.

## 2020-11-24 LAB — COMPLETE METABOLIC PANEL WITH GFR
AG Ratio: 1.7 (calc) (ref 1.0–2.5)
ALT: 28 U/L (ref 6–29)
AST: 24 U/L (ref 10–35)
Albumin: 4.4 g/dL (ref 3.6–5.1)
Alkaline phosphatase (APISO): 55 U/L (ref 37–153)
BUN: 16 mg/dL (ref 7–25)
CO2: 26 mmol/L (ref 20–32)
Calcium: 10.3 mg/dL (ref 8.6–10.4)
Chloride: 105 mmol/L (ref 98–110)
Creat: 0.83 mg/dL (ref 0.50–1.05)
GFR, Est African American: 91 mL/min/{1.73_m2} (ref >=60)
GFR, Est Non African American: 79 mL/min/{1.73_m2} (ref >=60)
Globulin: 2.6 g/dL (calc) (ref 1.9–3.7)
Glucose, Bld: 99 mg/dL (ref 65–99)
Potassium: 4.4 mmol/L (ref 3.5–5.3)
Sodium: 139 mmol/L (ref 135–146)
Total Bilirubin: 0.4 mg/dL (ref 0.2–1.2)
Total Protein: 7 g/dL (ref 6.1–8.1)

## 2020-11-24 LAB — CBC WITH DIFFERENTIAL/PLATELET
Absolute Monocytes: 487 cells/uL (ref 200–950)
Basophils Absolute: 52 cells/uL (ref 0–200)
Basophils Relative: 0.9 %
Eosinophils Absolute: 191 cells/uL (ref 15–500)
Eosinophils Relative: 3.3 %
HCT: 46.4 % — ABNORMAL HIGH (ref 35.0–45.0)
Hemoglobin: 15.7 g/dL — ABNORMAL HIGH (ref 11.7–15.5)
Lymphs Abs: 1757 cells/uL (ref 850–3900)
MCH: 31.8 pg (ref 27.0–33.0)
MCHC: 33.8 g/dL (ref 32.0–36.0)
MCV: 94.1 fL (ref 80.0–100.0)
MPV: 10.3 fL (ref 7.5–12.5)
Monocytes Relative: 8.4 %
Neutro Abs: 3312 cells/uL (ref 1500–7800)
Neutrophils Relative %: 57.1 %
Platelets: 288 10*3/uL (ref 140–400)
RBC: 4.93 10*6/uL (ref 3.80–5.10)
RDW: 12.4 % (ref 11.0–15.0)
Total Lymphocyte: 30.3 %
WBC: 5.8 10*3/uL (ref 3.8–10.8)

## 2020-11-24 LAB — TSH(REFL): TSH: 0.99 mIU/L (ref 0.40–4.50)

## 2020-11-24 LAB — REFLEX TIQ

## 2021-03-17 ENCOUNTER — Other Ambulatory Visit: Payer: Self-pay | Admitting: Medical-Surgical

## 2021-03-17 DIAGNOSIS — Z1231 Encounter for screening mammogram for malignant neoplasm of breast: Secondary | ICD-10-CM

## 2021-04-05 ENCOUNTER — Ambulatory Visit (INDEPENDENT_AMBULATORY_CARE_PROVIDER_SITE_OTHER): Payer: BC Managed Care – PPO

## 2021-04-05 ENCOUNTER — Other Ambulatory Visit: Payer: Self-pay

## 2021-04-05 DIAGNOSIS — Z1231 Encounter for screening mammogram for malignant neoplasm of breast: Secondary | ICD-10-CM | POA: Diagnosis not present

## 2021-04-19 ENCOUNTER — Ambulatory Visit: Payer: BC Managed Care – PPO | Admitting: Medical-Surgical

## 2021-04-19 ENCOUNTER — Other Ambulatory Visit: Payer: Self-pay

## 2021-04-19 ENCOUNTER — Encounter: Payer: Self-pay | Admitting: Medical-Surgical

## 2021-04-19 VITALS — BP 141/76 | HR 83 | Resp 20 | Ht 66.0 in | Wt 258.0 lb

## 2021-04-19 DIAGNOSIS — I10 Essential (primary) hypertension: Secondary | ICD-10-CM

## 2021-04-19 DIAGNOSIS — K219 Gastro-esophageal reflux disease without esophagitis: Secondary | ICD-10-CM

## 2021-04-19 DIAGNOSIS — N289 Disorder of kidney and ureter, unspecified: Secondary | ICD-10-CM

## 2021-04-19 DIAGNOSIS — J301 Allergic rhinitis due to pollen: Secondary | ICD-10-CM

## 2021-04-19 DIAGNOSIS — M62838 Other muscle spasm: Secondary | ICD-10-CM

## 2021-04-19 DIAGNOSIS — E559 Vitamin D deficiency, unspecified: Secondary | ICD-10-CM | POA: Diagnosis not present

## 2021-04-19 DIAGNOSIS — Z7689 Persons encountering health services in other specified circumstances: Secondary | ICD-10-CM | POA: Diagnosis not present

## 2021-04-19 MED ORDER — LOSARTAN POTASSIUM 100 MG PO TABS: ORAL_TABLET | ORAL | 1 refills | Status: DC

## 2021-04-19 MED ORDER — CYCLOBENZAPRINE HCL 5 MG PO TABS: 5.0000 mg | ORAL_TABLET | ORAL | 1 refills | Status: DC | PRN

## 2021-04-19 MED ORDER — METOPROLOL SUCCINATE ER 25 MG PO TB24: ORAL_TABLET | ORAL | 2 refills | Status: DC

## 2021-04-19 NOTE — Progress Notes (Signed)
  HPI with pertinent ROS:   CC: transfer of care, BP issues  HPI: Pleasant 55 year old female presenting today for transfer care to a new PCP and to discuss the following:  Hypertension-taking metoprolol succinate 25 mg daily with losartan 100 mg daily, tolerating well without side effects.  Has been on this for a while and was hoping to be able to come down on some of her blood pressure medicines but notes that after COVID back in February her blood pressure seem to be more elevated.  This morning, she checked her blood pressure on her home machine with a reading of 117/74 however it is a little higher here.  She does note some whitecoat syndrome. Denies CP, SOB, palpitations, lower extremity edema, dizziness, headaches, or vision changes.  GERD-taking omeprazole 20 mg as needed, tolerating well without side effects.  Uses this sparingly as she knows there is risk for osteoporosis with this medication.  Seasonal allergies-using Xyzal 5 mg daily, tolerating well without side effects.  Working well to control her seasonal allergy issues.  Vitamin D deficiency-takes a multivitamin as well as a separate vitamin D supplement daily.  Muscle spasms-was evaluated by ENT and determined to have muscle spasms in the throat area.  She uses Flexeril 5 mg as needed for this with good efficiency.  I reviewed the past medical history, family history, social history, surgical history, and allergies today and no changes were needed.  Please see the problem list section below in epic for further details.   Physical exam:   General: Well Developed, well nourished, and in no acute distress.  Neuro: Alert and oriented x3.  HEENT: Normocephalic, atraumatic.  Skin: Warm and dry. Cardiac: Regular rate and rhythm, no murmurs rubs or gallops, no lower extremity edema.  Respiratory: Clear to auscultation bilaterally. Not using accessory muscles, speaking in full sentences.  Impression and Recommendations:    1.  Encounter to establish care Reviewed available information and discussed care concerns with patient.   2. Hypertension goal BP (blood pressure) < 130/80 Blood pressure elevated here in the office however her home readings are at goal and look really good.  Continue losartan 100 mg daily and Toprol-XL 25 mg daily as prescribed.  Refills sent to the pharmacy.  3. Renal insufficiency Last labs checked in June so no need to recheck right now.  We will plan to recheck this when she comes back in March.  4. Vitamin D deficiency Continue oral vitamin D supplementation and multivitamin daily.  5. Gastroesophageal reflux disease, unspecified whether esophagitis present Continue omeprazole 20 mg daily as needed.  6. Seasonal allergic rhinitis due to pollen Continue Xyzal 5 mg daily.  7.  Muscle spasm Continue Flexeril 5 mg as needed.  Refill sent to pharmacy per request.  Return in about 4 months (around 08/17/2021) for chronic disease follow up. ___________________________________________ Thayer Ohm, DNP, APRN, FNP-BC Primary Care and Sports Medicine [facility] [place]

## 2021-08-01 ENCOUNTER — Other Ambulatory Visit: Payer: Self-pay

## 2021-08-01 ENCOUNTER — Ambulatory Visit: Payer: BC Managed Care – PPO | Admitting: Obstetrics and Gynecology

## 2021-08-01 ENCOUNTER — Encounter: Payer: Self-pay | Admitting: Obstetrics and Gynecology

## 2021-08-01 VITALS — BP 130/90 | HR 88 | Ht 66.0 in | Wt 257.0 lb

## 2021-08-01 DIAGNOSIS — N951 Menopausal and female climacteric states: Secondary | ICD-10-CM | POA: Diagnosis not present

## 2021-08-01 DIAGNOSIS — Z7689 Persons encountering health services in other specified circumstances: Secondary | ICD-10-CM

## 2021-08-01 NOTE — Progress Notes (Signed)
56 yo with BMI 41 and LMP 05/10/21 presenting today to establish care and discuss menopausal symptoms. Patient reports doing well. She admits to irregular menses now consisting of a few days of spotting several months apart. She also admits to some vasomotor symptoms and mood swings. She denies urinary incontinence, pelvic pain or abnormal discharge. Patient states that she is due for her next pap smear in 2024  Past Medical History:  Diagnosis Date   Heterozygous factor V Leiden mutation (HCC) 10/21/2017   Hypertension    Multiple thyroid nodules    Osteoarthritis of hand    Renal insufficiency 01/07/2019   Past Surgical History:  Procedure Laterality Date   ARTHROSCOPIC HAGLUNDS REPAIR Right    FOOT SURGERY Right    Family History  Problem Relation Age of Onset   Diabetes Father    Hypertension Father    Heart attack Father    Factor V Leiden deficiency Father    Diabetes Sister    Hyperlipidemia Sister    Hypertension Sister    Hypertension Brother    Cancer Brother    Hypertension Brother    Thyroid cancer Brother    Hypertension Sister    Factor V Leiden deficiency Sister    Cancer Mother    Heart attack Mother    Social History   Tobacco Use   Smoking status: Never   Smokeless tobacco: Never  Vaping Use   Vaping Use: Never used  Substance Use Topics   Alcohol use: Yes   Drug use: Never   ROS See pertinent in HPI. All other systems reviewed and non contributory Blood pressure 130/90, pulse 88, height 5\' 6"  (1.676 m), weight 257 lb (116.6 kg), last menstrual period 05/10/2021. GENERAL: Well-developed, well-nourished female in no acute distress.  NEURO: alert and oriented x 3  A/P 56 yo with menopausal symptoms - Reassurance provided - Discussed use of over the counter black cohosh. Patient is not interested in HRT at this time nor do I think it is indicated for her current mild symptoms - Patient plans blood work with PCP next month - Patient is current on  colonoscopy and mammogram - Advised to keep a menstrual calendar and informed that menopause is the absence of menses for 12 months.  - Patient plans pap smear with PCP - RTC prn

## 2021-08-11 ENCOUNTER — Other Ambulatory Visit: Payer: Self-pay

## 2021-08-11 ENCOUNTER — Encounter: Payer: Self-pay | Admitting: Medical-Surgical

## 2021-08-11 ENCOUNTER — Ambulatory Visit (INDEPENDENT_AMBULATORY_CARE_PROVIDER_SITE_OTHER): Payer: BC Managed Care – PPO | Admitting: Medical-Surgical

## 2021-08-11 VITALS — BP 119/84 | HR 114 | Ht 66.0 in | Wt 253.0 lb

## 2021-08-11 DIAGNOSIS — R221 Localized swelling, mass and lump, neck: Secondary | ICD-10-CM

## 2021-08-11 DIAGNOSIS — Z1159 Encounter for screening for other viral diseases: Secondary | ICD-10-CM

## 2021-08-11 DIAGNOSIS — N289 Disorder of kidney and ureter, unspecified: Secondary | ICD-10-CM

## 2021-08-11 DIAGNOSIS — K219 Gastro-esophageal reflux disease without esophagitis: Secondary | ICD-10-CM | POA: Diagnosis not present

## 2021-08-11 DIAGNOSIS — Z0000 Encounter for general adult medical examination without abnormal findings: Secondary | ICD-10-CM

## 2021-08-11 DIAGNOSIS — E042 Nontoxic multinodular goiter: Secondary | ICD-10-CM | POA: Diagnosis not present

## 2021-08-11 DIAGNOSIS — E559 Vitamin D deficiency, unspecified: Secondary | ICD-10-CM | POA: Diagnosis not present

## 2021-08-11 DIAGNOSIS — I10 Essential (primary) hypertension: Secondary | ICD-10-CM

## 2021-08-11 DIAGNOSIS — Z114 Encounter for screening for human immunodeficiency virus [HIV]: Secondary | ICD-10-CM

## 2021-08-11 NOTE — Progress Notes (Signed)
HPI: Desiree Valencia is a 56 y.o. female who  has a past medical history of Heterozygous factor V Leiden mutation ([place]) (10/21/2017), Hypertension, Multiple thyroid nodules, Osteoarthritis of hand, and Renal insufficiency (01/07/2019).  she presents to [facility] today, 08/11/21,  for chief complaint of: Annual physical exam  Dentist: UTD, every 6 months Eye exam: UTD, due in May, wears glasses but no contacts Exercise: Has gradually increased her activity and is doing a little better on this Diet: No dietary restrictions but has been making changes for healthier intake and weight loss Pap smear: Due next year Mammogram: Up-to-date Colon cancer screening: Done in 2016 COVID vaccine: Done no boosters  Concerns: Small lump on the right side of her neck that is tender.  Area has been tender for about 2 months but found the lump last week.  She has a full range of motion and notes that the discomfort is more nagging than painful.  No recent illnesses or injuries.  Past medical, surgical, social and family history reviewed:  Patient Active Problem List   Diagnosis Date Noted   Vitamin D deficiency 06/25/2019   Gastroesophageal reflux disease 06/25/2019   Renal insufficiency 01/07/2019   Allergic conjunctivitis of both eyes and rhinitis 08/18/2018   Seasonal allergic rhinitis due to pollen 08/18/2018   Ganglion cyst of dorsum of right wrist 06/16/2018   Tachycardia with heart rate 100-120 beats per minute 06/16/2018   Hypertension goal BP (blood pressure) < 130/80 12/19/2017   Intertrigo 11/18/2017   Class 2 obesity due to excess calories without serious comorbidity in adult 10/28/2017   Heterozygous factor V Leiden mutation ([place]) 10/21/2017   Thyroid nodule 10/17/2017   Family history of thyroid cancer 10/17/2017   Family history of factor V Leiden mutation 10/17/2017   Osteoarthritis of hand    Multiple thyroid nodules     Past Surgical History:   Procedure Laterality Date   ARTHROSCOPIC HAGLUNDS REPAIR Right    FOOT SURGERY Right     Social History   Tobacco Use   Smoking status: Never   Smokeless tobacco: Never  Substance Use Topics   Alcohol use: Yes    Family History  Problem Relation Age of Onset   Diabetes Father    Hypertension Father    Heart attack Father    Factor V Leiden deficiency Father    Diabetes Sister    Hyperlipidemia Sister    Hypertension Sister    Hypertension Brother    Cancer Brother    Hypertension Brother    Thyroid cancer Brother    Hypertension Sister    Factor V Leiden deficiency Sister    Cancer Mother    Heart attack Mother      Current medication list and allergy/intolerance information reviewed:    Current Outpatient Medications  Medication Sig Dispense Refill   acetaminophen (TYLENOL) 650 MG CR tablet Take 1-2 tablets (650-1,300 mg total) by mouth every 8 (eight) hours as needed for pain. 90 tablet 3   aspirin 81 MG chewable tablet Chew 81 mg by mouth daily.     Cholecalciferol (VITAMIN D PO) Take by mouth.     cyclobenzaprine (FLEXERIL) 5 MG tablet Take 1 tablet (5 mg total) by mouth as needed for muscle spasms. 90 tablet 1   diclofenac sodium (VOLTAREN) 1 % GEL Apply 2 g topically 4 (four) times daily. To affected joint. 100 g 11   levocetirizine (XYZAL) 5 MG tablet Take 1 tablet (5 mg total)  by mouth every evening. 90 tablet 3   losartan (COZAAR) 100 MG tablet Take 1 tablet (100 mg total) by mouth daily. 90 tablet 1   metoprolol succinate (TOPROL-XL) 25 MG 24 hr tablet Take 1 tablet (25 mg total) by mouth daily. 90 tablet 2   omeprazole (PRILOSEC) 20 MG capsule Take 20 mg by mouth daily.     No current facility-administered medications for this visit.    Allergies  Allergen Reactions   Codeine Shortness Of Breath and Anaphylaxis   Ace Inhibitors Other (See Comments)    Cough; Rash    Red Dye Other (See Comments)    Rash       Review of  Systems: Constitutional:  No  fever, no chills, No recent illness, No unintentional weight changes. No significant fatigue.  HEENT: No  headache, no vision change, no hearing change, No sore throat, No  sinus pressure, + small sore lump on the right side of her neck Cardiac: No  chest pain, No  pressure, No palpitations, No  Orthopnea Respiratory:  No  shortness of breath. No  Cough Gastrointestinal: No  abdominal pain, No  nausea, No  vomiting,  No  blood in stool, No  diarrhea, No  constipation  Musculoskeletal: No new myalgia/arthralgia Skin: No  Rash, No other wounds/concerning lesions Genitourinary: No  incontinence, No  abnormal genital bleeding, No abnormal genital discharge Hem/Onc: No  easy bruising/bleeding, No  abnormal lymph node Endocrine: No cold intolerance,  No heat intolerance. No polyuria/polydipsia/polyphagia  Neurologic: No  weakness, No  dizziness, No  slurred speech/focal weakness/facial droop Psychiatric: No  concerns with depression, No  concerns with anxiety, No sleep problems, No mood problems  Exam:  BP 119/84    Pulse (!) 114    Ht _0  (1.676 m)    Wt 253 lb (114.8 kg)    SpO2 99%    BMI 40.84 kg/m  Constitutional: VS see above. General Appearance: alert, well-developed, well-nourished, NAD Eyes: Normal lids and conjunctive, non-icteric sclera Ears, Nose, Mouth, Throat: MMM, Normal external inspection ears/nares/mouth/lips/gums. TM normal bilaterally. One mildly enlarged lymph node <1cm in size palpable on the right side of the neck.  Neck: No masses, trachea midline. No thyroid enlargement. No tenderness/mass appreciated. No lymphadenopathy Respiratory: Normal respiratory effort. no wheeze, no rhonchi, no rales Cardiovascular: S1/S2 normal, no murmur, no rub/gallop auscultated. RRR. No lower extremity edema. Pedal pulse II/IV bilaterally PT. No carotid bruit or JVD. No abdominal aortic bruit. Gastrointestinal: Nontender, no masses. No hepatomegaly, no  splenomegaly. No hernia appreciated. Bowel sounds normal. Rectal exam deferred.  Musculoskeletal: Gait normal. No clubbing/cyanosis of digits.  Neurological: Normal balance/coordination. No tremor. No cranial nerve deficit on limited exam. Motor and sensation intact and symmetric. Cerebellar reflexes intact.  Skin: warm, dry, intact. No rash/ulcer. No concerning nevi or subq nodules on limited exam.   Psychiatric: Normal judgment/insight. Normal mood and affect. Oriented x3.    ASSESSMENT/PLAN:   1. Annual physical exam Checking labs as below.  Up-to-date on preventative care.  Wellness information provided with AVS. - CBC with Differential/Platelet - COMPLETE METABOLIC PANEL WITH GFR - Lipid Panel w/reflex Direct LDL  2. Renal insufficiency Checking CMP.  3. Gastroesophageal reflux disease, unspecified whether esophagitis present Continue omeprazole 20 mg daily.  4. Multiple thyroid nodules Checking TSH. - TSH  5. Hypertension goal BP (blood pressure) < 130/80 Continue losartan 100 mg and Toprol XL 25 mg daily as prescribed.  Blood pressure at goal.  6. Vitamin D  deficiency Checking vitamin D. - VITAMIN D 25 Hydroxy (Vit-D Deficiency, Fractures)  7. Need for hepatitis C screening test Discussed screening recommendations.  Patient is agreeable to adding to blood work today. - Hepatitis C Antibody  8. Encounter for screening for HIV Discussed screening recommendations.  Patient is agreeable to adding to blood work today. - HIV antibody (with reflex)  9. Lump in neck Low concern for mildly enlarged tender single lymph node in the right neck however we will go ahead and investigate this a little further with a soft tissue ultrasound - US Soft Tissue Head/Neck (NON-THYROID); Future   Orders Placed This Encounter  Procedures   US Soft Tissue Head/Neck (NON-THYROID)   CBC with Differential/Platelet   COMPLETE METABOLIC PANEL WITH GFR   Lipid Panel w/reflex Direct LDL    TSH   VITAMIN D 25 Hydroxy (Vit-D Deficiency, Fractures)   HIV antibody (with reflex)   Hepatitis C Antibody    No orders of the defined types were placed in this encounter.   Patient Instructions  Preventive Care 75-14 Years Old, Female Preventive care refers to lifestyle choices and visits with your health care provider that can promote health and wellness. Preventive care visits are also called wellness exams. What can I expect for my preventive care visit? Counseling Your health care provider may ask you questions about your: Medical history, including: Past medical problems. Family medical history. Pregnancy history. Current health, including: Menstrual cycle. Method of birth control. Emotional well-being. Home life and relationship well-being. Sexual activity and sexual health. Lifestyle, including: Alcohol, nicotine or tobacco, and drug use. Access to firearms. Diet, exercise, and sleep habits. Work and work Statistician. Sunscreen use. Safety issues such as seatbelt and bike helmet use. Physical exam Your health care provider will check your: Height and weight. These may be used to calculate your BMI (body mass index). BMI is a measurement that tells if you are at a healthy weight. Waist circumference. This measures the distance around your waistline. This measurement also tells if you are at a healthy weight and may help predict your risk of certain diseases, such as type 2 diabetes and high blood pressure. Heart rate and blood pressure. Body temperature. Skin for abnormal spots. What immunizations do I need? Vaccines are usually given at various ages, according to a schedule. Your health care provider will recommend vaccines for you based on your age, medical history, and lifestyle or other factors, such as travel or where you work. What tests do I need? Screening Your health care provider may recommend screening tests for certain conditions. This may  include: Lipid and cholesterol levels. Diabetes screening. This is done by checking your blood sugar (glucose) after you have not eaten for a while (fasting). Pelvic exam and Pap test. Hepatitis B test. Hepatitis C test. HIV (human immunodeficiency virus) test. STI (sexually transmitted infection) testing, if you are at risk. Lung cancer screening. Colorectal cancer screening. Mammogram. Talk with your health care provider about when you should start having regular mammograms. This may depend on whether you have a family history of breast cancer. BRCA-related cancer screening. This may be done if you have a family history of breast, ovarian, tubal, or peritoneal cancers. Bone density scan. This is done to screen for osteoporosis. Talk with your health care provider about your test results, treatment options, and if necessary, the need for more tests. Follow these instructions at home: Eating and drinking  Eat a diet that includes fresh fruits and vegetables, whole grains,  lean protein, and low-fat dairy products. Take vitamin and mineral supplements as recommended by your health care provider. Do not drink alcohol if: Your health care provider tells you not to drink. You are pregnant, may be pregnant, or are planning to become pregnant. If you drink alcohol: Limit how much you have to 0-1 drink a day. Know how much alcohol is in your drink. In the [place] one drink equals one 12 oz bottle of beer (355 mL), one 5 oz glass of wine (148 mL), or one 1 oz glass of hard liquor (44 mL). Lifestyle Brush your teeth every morning and night with fluoride toothpaste. Floss one time each day. Exercise for at least 30 minutes 5 or more days each week. Do not use any products that contain nicotine or tobacco. These products include cigarettes, chewing tobacco, and vaping devices, such as e-cigarettes. If you need help quitting, ask your health care provider. Do not use drugs. If you are sexually  active, practice safe sex. Use a condom or other form of protection to prevent STIs. If you do not wish to become pregnant, use a form of birth control. If you plan to become pregnant, see your health care provider for a prepregnancy visit. Take aspirin only as told by your health care provider. Make sure that you understand how much to take and what form to take. Work with your health care provider to find out whether it is safe and beneficial for you to take aspirin daily. Find healthy ways to manage stress, such as: Meditation, yoga, or listening to music. Journaling. Talking to a trusted person. Spending time with friends and family. Minimize exposure to UV radiation to reduce your risk of skin cancer. Safety Always wear your seat belt while driving or riding in a vehicle. Do not drive: If you have been drinking alcohol. Do not ride with someone who has been drinking. When you are tired or distracted. While texting. If you have been using any mind-altering substances or drugs. Wear a helmet and other protective equipment during sports activities. If you have firearms in your house, make sure you follow all gun safety procedures. Seek help if you have been physically or sexually abused. What's next? Visit your health care provider once a year for an annual wellness visit. Ask your health care provider how often you should have your eyes and teeth checked. Stay up to date on all vaccines. This information is not intended to replace advice given to you by your health care provider. Make sure you discuss any questions you have with your health care provider. Document Revised: 11/16/2020 Document Reviewed: 11/16/2020 Elsevier Patient Education  [place].  Follow-up plan: Return in about 6 months (around 02/11/2022) for HTN follow up.  Clearnce Sorrel, DNP, APRN, FNP-BC [facility]

## 2021-08-11 NOTE — Patient Instructions (Signed)
Preventive Care 26-57 Years Old, Female ?Preventive care refers to lifestyle choices and visits with your health care provider that can promote health and wellness. Preventive care visits are also called wellness exams. ?What can I expect for my preventive care visit? ?Counseling ?Your health care provider may ask you questions about your: ?Medical history, including: ?Past medical problems. ?Family medical history. ?Pregnancy history. ?Current health, including: ?Menstrual cycle. ?Method of birth control. ?Emotional well-being. ?Home life and relationship well-being. ?Sexual activity and sexual health. ?Lifestyle, including: ?Alcohol, nicotine or tobacco, and drug use. ?Access to firearms. ?Diet, exercise, and sleep habits. ?Work and work Statistician. ?Sunscreen use. ?Safety issues such as seatbelt and bike helmet use. ?Physical exam ?Your health care provider will check your: ?Height and weight. These may be used to calculate your BMI (body mass index). BMI is a measurement that tells if you are at a healthy weight. ?Waist circumference. This measures the distance around your waistline. This measurement also tells if you are at a healthy weight and may help predict your risk of certain diseases, such as type 2 diabetes and high blood pressure. ?Heart rate and blood pressure. ?Body temperature. ?Skin for abnormal spots. ?What immunizations do I need? ?Vaccines are usually given at various ages, according to a schedule. Your health care provider will recommend vaccines for you based on your age, medical history, and lifestyle or other factors, such as travel or where you work. ?What tests do I need? ?Screening ?Your health care provider may recommend screening tests for certain conditions. This may include: ?Lipid and cholesterol levels. ?Diabetes screening. This is done by checking your blood sugar (glucose) after you have not eaten for a while (fasting). ?Pelvic exam and Pap test. ?Hepatitis B test. ?Hepatitis C  test. ?HIV (human immunodeficiency virus) test. ?STI (sexually transmitted infection) testing, if you are at risk. ?Lung cancer screening. ?Colorectal cancer screening. ?Mammogram. Talk with your health care provider about when you should start having regular mammograms. This may depend on whether you have a family history of breast cancer. ?BRCA-related cancer screening. This may be done if you have a family history of breast, ovarian, tubal, or peritoneal cancers. ?Bone density scan. This is done to screen for osteoporosis. ?Talk with your health care provider about your test results, treatment options, and if necessary, the need for more tests. ?Follow these instructions at home: ?Eating and drinking ? ?Eat a diet that includes fresh fruits and vegetables, whole grains, lean protein, and low-fat dairy products. ?Take vitamin and mineral supplements as recommended by your health care provider. ?Do not drink alcohol if: ?Your health care provider tells you not to drink. ?You are pregnant, may be pregnant, or are planning to become pregnant. ?If you drink alcohol: ?Limit how much you have to 0-1 drink a day. ?Know how much alcohol is in your drink. In the [place] one drink equals one 12 oz bottle of beer (355 mL), one 5 oz glass of wine (148 mL), or one 1? oz glass of hard liquor (44 mL). ?Lifestyle ?Brush your teeth every morning and night with fluoride toothpaste. Floss one time each day. ?Exercise for at least 30 minutes 5 or more days each week. ?Do not use any products that contain nicotine or tobacco. These products include cigarettes, chewing tobacco, and vaping devices, such as e-cigarettes. If you need help quitting, ask your health care provider. ?Do not use drugs. ?If you are sexually active, practice safe sex. Use a condom or other form of protection to prevent  STIs. °If you do not wish to become pregnant, use a form of birth control. If you plan to become pregnant, see your health care provider for a  prepregnancy visit. °Take aspirin only as told by your health care provider. Make sure that you understand how much to take and what form to take. Work with your health care provider to find out whether it is safe and beneficial for you to take aspirin daily. °Find healthy ways to manage stress, such as: °Meditation, yoga, or listening to music. °Journaling. °Talking to a trusted person. °Spending time with friends and family. °Minimize exposure to UV radiation to reduce your risk of skin cancer. °Safety °Always wear your seat belt while driving or riding in a vehicle. °Do not drive: °If you have been drinking alcohol. Do not ride with someone who has been drinking. °When you are tired or distracted. °While texting. °If you have been using any mind-altering substances or drugs. °Wear a helmet and other protective equipment during sports activities. °If you have firearms in your house, make sure you follow all gun safety procedures. °Seek help if you have been physically or sexually abused. °What's next? °Visit your health care provider once a year for an annual wellness visit. °Ask your health care provider how often you should have your eyes and teeth checked. °Stay up to date on all vaccines. °This information is not intended to replace advice given to you by your health care provider. Make sure you discuss any questions you have with your health care provider. °Document Revised: 11/16/2020 Document Reviewed: 11/16/2020 °Elsevier Patient Education © 2022 Elsevier Inc. ° °

## 2021-08-14 LAB — LIPID PANEL W/REFLEX DIRECT LDL
Cholesterol: 176 mg/dL (ref <200)
HDL: 44 mg/dL — ABNORMAL LOW (ref >=50)
LDL Cholesterol (Calc): 110 mg/dL (calc) — ABNORMAL HIGH
Non-HDL Cholesterol (Calc): 132 mg/dL (calc) — ABNORMAL HIGH (ref <130)
Total CHOL/HDL Ratio: 4 (calc) (ref <5.0)
Triglycerides: 109 mg/dL (ref <150)

## 2021-08-14 LAB — TSH: TSH: 0.58 mIU/L (ref 0.40–4.50)

## 2021-08-14 LAB — COMPLETE METABOLIC PANEL WITH GFR
AG Ratio: 1.5 (calc) (ref 1.0–2.5)
ALT: 23 U/L (ref 6–29)
AST: 20 U/L (ref 10–35)
Albumin: 4.4 g/dL (ref 3.6–5.1)
Alkaline phosphatase (APISO): 59 U/L (ref 37–153)
BUN: 14 mg/dL (ref 7–25)
CO2: 25 mmol/L (ref 20–32)
Calcium: 9.7 mg/dL (ref 8.6–10.4)
Chloride: 103 mmol/L (ref 98–110)
Creat: 0.9 mg/dL (ref 0.50–1.03)
Globulin: 2.9 g/dL (calc) (ref 1.9–3.7)
Glucose, Bld: 100 mg/dL — ABNORMAL HIGH (ref 65–99)
Potassium: 4.2 mmol/L (ref 3.5–5.3)
Sodium: 140 mmol/L (ref 135–146)
Total Bilirubin: 0.7 mg/dL (ref 0.2–1.2)
Total Protein: 7.3 g/dL (ref 6.1–8.1)
eGFR: 75 mL/min/{1.73_m2} (ref >=60)

## 2021-08-14 LAB — CBC WITH DIFFERENTIAL/PLATELET
Absolute Monocytes: 764 cells/uL (ref 200–950)
Basophils Absolute: 50 cells/uL (ref 0–200)
Basophils Relative: 0.6 %
Eosinophils Absolute: 133 cells/uL (ref 15–500)
Eosinophils Relative: 1.6 %
HCT: 46.4 % — ABNORMAL HIGH (ref 35.0–45.0)
Hemoglobin: 15.9 g/dL — ABNORMAL HIGH (ref 11.7–15.5)
Lymphs Abs: 1610 cells/uL (ref 850–3900)
MCH: 32 pg (ref 27.0–33.0)
MCHC: 34.3 g/dL (ref 32.0–36.0)
MCV: 93.4 fL (ref 80.0–100.0)
MPV: 10.1 fL (ref 7.5–12.5)
Monocytes Relative: 9.2 %
Neutro Abs: 5744 cells/uL (ref 1500–7800)
Neutrophils Relative %: 69.2 %
Platelets: 259 10*3/uL (ref 140–400)
RBC: 4.97 10*6/uL (ref 3.80–5.10)
RDW: 12.6 % (ref 11.0–15.0)
Total Lymphocyte: 19.4 %
WBC: 8.3 10*3/uL (ref 3.8–10.8)

## 2021-08-14 LAB — VITAMIN D 25 HYDROXY (VIT D DEFICIENCY, FRACTURES): Vit D, 25-Hydroxy: 81 ng/mL (ref 30–100)

## 2021-08-14 LAB — HIV ANTIBODY (ROUTINE TESTING W REFLEX): HIV 1&2 Ab, 4th Generation: NONREACTIVE

## 2021-08-14 LAB — HEPATITIS C ANTIBODY
Hepatitis C Ab: NONREACTIVE
SIGNAL TO CUT-OFF: <0.02 (ref <1.00)

## 2021-08-17 ENCOUNTER — Other Ambulatory Visit: Payer: Self-pay

## 2021-08-17 ENCOUNTER — Other Ambulatory Visit: Payer: Self-pay | Admitting: Medical-Surgical

## 2021-08-17 ENCOUNTER — Ambulatory Visit (INDEPENDENT_AMBULATORY_CARE_PROVIDER_SITE_OTHER): Payer: BC Managed Care – PPO

## 2021-08-17 ENCOUNTER — Ambulatory Visit: Payer: BC Managed Care – PPO

## 2021-08-17 DIAGNOSIS — E042 Nontoxic multinodular goiter: Secondary | ICD-10-CM | POA: Diagnosis not present

## 2021-08-17 DIAGNOSIS — R221 Localized swelling, mass and lump, neck: Secondary | ICD-10-CM | POA: Diagnosis not present

## 2021-08-21 ENCOUNTER — Ambulatory Visit (INDEPENDENT_AMBULATORY_CARE_PROVIDER_SITE_OTHER): Payer: BC Managed Care – PPO

## 2021-08-21 ENCOUNTER — Other Ambulatory Visit: Payer: Self-pay

## 2021-08-21 DIAGNOSIS — E042 Nontoxic multinodular goiter: Secondary | ICD-10-CM

## 2021-10-02 ENCOUNTER — Other Ambulatory Visit: Payer: Self-pay | Admitting: Medical-Surgical

## 2021-12-29 ENCOUNTER — Other Ambulatory Visit: Payer: Self-pay | Admitting: Medical-Surgical

## 2022-01-18 ENCOUNTER — Other Ambulatory Visit: Payer: Self-pay | Admitting: Medical-Surgical

## 2022-02-12 ENCOUNTER — Ambulatory Visit: Payer: BC Managed Care – PPO | Admitting: Medical-Surgical

## 2022-02-26 NOTE — Progress Notes (Unsigned)
   Established Patient Office Visit  Subjective   Patient ID: Veria Stradley, female   DOB: [birth year] Age: 56 y.o. MRN: [id number]   No chief complaint on file.   HPI Pleasant 56 year old female presenting today for follow-up on:  HTN:   Objective:    There were no vitals filed for this visit.  Physical Exam   No results found for this or any previous visit (from the past 24 hour(s)).   {Labs (Optional):23779}  The 10-year ASCVD risk score (Arnett DK, et al., 2019) is: 3%   Values used to calculate the score:     Age: 28 years     Sex: Female     Is Non-Hispanic African American: No     Diabetic: No     Tobacco smoker: No     Systolic Blood Pressure: 528 mmHg     Is BP treated: Yes     HDL Cholesterol: 44 mg/dL     Total Cholesterol: 176 mg/dL   Assessment & Plan:   No problem-specific Assessment & Plan notes found for this encounter.   No follow-ups on file.  ___________________________________________ Clearnce Sorrel, DNP, APRN, FNP-BC [facility] [place]

## 2022-02-27 ENCOUNTER — Encounter: Payer: BC Managed Care – PPO | Admitting: Medical-Surgical

## 2022-03-26 ENCOUNTER — Other Ambulatory Visit: Payer: Self-pay | Admitting: Medical-Surgical

## 2022-03-26 DIAGNOSIS — Z1231 Encounter for screening mammogram for malignant neoplasm of breast: Secondary | ICD-10-CM

## 2022-03-26 NOTE — Progress Notes (Signed)
Established Patient Office Visit  Subjective   Patient ID: Desiree Valencia, female   DOB: Nov 14, 1965 Age: 56 y.o. MRN: [id number]   Chief Complaint  Patient presents with   Hypertension    HPI Pleasant 56 year old female presenting today for the following:  HTN: taking Losartan 100mg  and Toprol-XL 25mg  daily, tolerating well without side effects. Cooking habits changing slowly to incorporate healthier options and is eating smaller portions. Is signed up for a 5K walk next week. Following a low sodium diet. Endorses occasional palpitations, very infrequent. Denies CP, SOB, lower extremity edema, dizziness, headaches, or vision changes.   Thyroid nodules: Ultrasound performed in March 2023 showed multiple thyroid nodules.  Currently no concerns with weight fluctuations, temperature instability, frequent palpitations, skin/hair changes, or GI concerns.  GERD: taking Omeprazole 20mg  daily prn, only taking a few days per month. Limiting known food triggers as much as possible.   Vitamin D deficiency: taking Vitamin D daily.    Objective:    Vitals:   03/27/22 1313  BP: 119/78  Pulse: 72  Height: 5\' 6"  (1.676 m)  Weight: 250 lb (113.4 kg)  SpO2: 98%  BMI (Calculated): 40.37    Physical Exam Vitals and nursing note reviewed.  Constitutional:      General: She is not in acute distress.    Appearance: Normal appearance. She is obese. She is not ill-appearing.  HENT:     Head: Normocephalic and atraumatic.  Cardiovascular:     Rate and Rhythm: Normal rate and regular rhythm.     Pulses: Normal pulses.     Heart sounds: Normal heart sounds.  Pulmonary:     Effort: Pulmonary effort is normal. No respiratory distress.     Breath sounds: Normal breath sounds. No wheezing, rhonchi or rales.  Skin:    General: Skin is warm and dry.  Neurological:     Mental Status: She is alert and oriented to person, place, and time.  Psychiatric:        Mood and Affect: Mood normal.         Behavior: Behavior normal.        Thought Content: Thought content normal.        Judgment: Judgment normal.   No results found for this or any previous visit (from the past 24 hour(s)).     The 10-year ASCVD risk score (Arnett DK, et al., 2019) is: 3%   Values used to calculate the score:     Age: 52 years     Sex: Female     Is Non-Hispanic African American: No     Diabetic: No     Tobacco smoker: No     Systolic Blood Pressure: 119 mmHg     Is BP treated: Yes     HDL Cholesterol: 44 mg/dL     Total Cholesterol: 176 mg/dL   Assessment & Plan:   1. Hypertension goal BP (blood pressure) < 130/80 Blood pressure at goal today.  Checking labs as below.  Continue losartan 100 mg and Toprol-XL 25 mg daily as prescribed.  Continue monitoring blood pressure at home with goal of 130/80 or less.  Low-sodium diet, regular intentional exercise, and weight loss with healthy weight recommended. - CBC with Differential/Platelet - COMPLETE METABOLIC PANEL WITH GFR - Lipid panel  2. Gastroesophageal reflux disease, unspecified whether esophagitis present Continue to avoid food triggers.  Okay to continue omeprazole 20 mg daily as needed.  3. Vitamin D deficiency Continue vitamin D supplementation.  4.  Multiple thyroid nodules Checking TSH. - TSH  Return in about 6 months (around 09/26/2022) for HTN follow up.  ___________________________________________ Clearnce Sorrel, DNP, APRN, FNP-BC [facility] [place]

## 2022-03-27 ENCOUNTER — Ambulatory Visit: Payer: BC Managed Care – PPO | Admitting: Medical-Surgical

## 2022-03-27 ENCOUNTER — Encounter: Payer: Self-pay | Admitting: Medical-Surgical

## 2022-03-27 VITALS — BP 119/78 | HR 72 | Ht 66.0 in | Wt 250.0 lb

## 2022-03-27 DIAGNOSIS — E042 Nontoxic multinodular goiter: Secondary | ICD-10-CM | POA: Diagnosis not present

## 2022-03-27 DIAGNOSIS — K219 Gastro-esophageal reflux disease without esophagitis: Secondary | ICD-10-CM | POA: Diagnosis not present

## 2022-03-27 DIAGNOSIS — I10 Essential (primary) hypertension: Secondary | ICD-10-CM

## 2022-03-27 DIAGNOSIS — E559 Vitamin D deficiency, unspecified: Secondary | ICD-10-CM

## 2022-03-28 LAB — CBC WITH DIFFERENTIAL/PLATELET
Absolute Monocytes: 395 cells/uL (ref 200–950)
Basophils Absolute: 41 cells/uL (ref 0–200)
Basophils Relative: 0.7 %
Eosinophils Absolute: 177 cells/uL (ref 15–500)
Eosinophils Relative: 3 %
HCT: 43.7 % (ref 35.0–45.0)
Hemoglobin: 15.1 g/dL (ref 11.7–15.5)
Lymphs Abs: 2095 cells/uL (ref 850–3900)
MCH: 32.2 pg (ref 27.0–33.0)
MCHC: 34.6 g/dL (ref 32.0–36.0)
MCV: 93.2 fL (ref 80.0–100.0)
MPV: 9.8 fL (ref 7.5–12.5)
Monocytes Relative: 6.7 %
Neutro Abs: 3192 cells/uL (ref 1500–7800)
Neutrophils Relative %: 54.1 %
Platelets: 260 10*3/uL (ref 140–400)
RBC: 4.69 10*6/uL (ref 3.80–5.10)
RDW: 12.4 % (ref 11.0–15.0)
Total Lymphocyte: 35.5 %
WBC: 5.9 10*3/uL (ref 3.8–10.8)

## 2022-03-28 LAB — COMPLETE METABOLIC PANEL WITH GFR
AG Ratio: 1.6 (calc) (ref 1.0–2.5)
ALT: 21 U/L (ref 6–29)
AST: 17 U/L (ref 10–35)
Albumin: 4.2 g/dL (ref 3.6–5.1)
Alkaline phosphatase (APISO): 55 U/L (ref 37–153)
BUN: 12 mg/dL (ref 7–25)
CO2: 24 mmol/L (ref 20–32)
Calcium: 9.4 mg/dL (ref 8.6–10.4)
Chloride: 104 mmol/L (ref 98–110)
Creat: 0.79 mg/dL (ref 0.50–1.03)
Globulin: 2.7 g/dL (calc) (ref 1.9–3.7)
Glucose, Bld: 95 mg/dL (ref 65–99)
Potassium: 3.9 mmol/L (ref 3.5–5.3)
Sodium: 139 mmol/L (ref 135–146)
Total Bilirubin: 0.7 mg/dL (ref 0.2–1.2)
Total Protein: 6.9 g/dL (ref 6.1–8.1)
eGFR: 87 mL/min/{1.73_m2} (ref >=60)

## 2022-03-28 LAB — LIPID PANEL
Cholesterol: 173 mg/dL (ref <200)
HDL: 43 mg/dL — ABNORMAL LOW (ref >=50)
LDL Cholesterol (Calc): 104 mg/dL (calc) — ABNORMAL HIGH
Non-HDL Cholesterol (Calc): 130 mg/dL (calc) — ABNORMAL HIGH (ref <130)
Total CHOL/HDL Ratio: 4 (calc) (ref <5.0)
Triglycerides: 145 mg/dL (ref <150)

## 2022-03-28 LAB — TSH: TSH: 0.76 mIU/L (ref 0.40–4.50)

## 2022-04-08 ENCOUNTER — Other Ambulatory Visit: Payer: Self-pay | Admitting: Medical-Surgical

## 2022-04-18 ENCOUNTER — Ambulatory Visit (INDEPENDENT_AMBULATORY_CARE_PROVIDER_SITE_OTHER): Payer: BC Managed Care – PPO

## 2022-04-18 DIAGNOSIS — Z1231 Encounter for screening mammogram for malignant neoplasm of breast: Secondary | ICD-10-CM

## 2022-04-19 ENCOUNTER — Other Ambulatory Visit: Payer: Self-pay | Admitting: Medical-Surgical

## 2022-05-24 ENCOUNTER — Encounter: Payer: Self-pay | Admitting: Medical-Surgical

## 2022-06-11 ENCOUNTER — Encounter: Payer: Self-pay | Admitting: Medical-Surgical

## 2022-06-11 ENCOUNTER — Ambulatory Visit: Payer: BC Managed Care – PPO | Admitting: Medical-Surgical

## 2022-06-11 ENCOUNTER — Ambulatory Visit: Payer: BC Managed Care – PPO | Attending: Medical-Surgical

## 2022-06-11 VITALS — BP 128/83 | HR 91 | Resp 20 | Ht 66.0 in | Wt 250.6 lb

## 2022-06-11 DIAGNOSIS — R002 Palpitations: Secondary | ICD-10-CM

## 2022-06-11 DIAGNOSIS — R0789 Other chest pain: Secondary | ICD-10-CM

## 2022-06-11 DIAGNOSIS — R42 Dizziness and giddiness: Secondary | ICD-10-CM

## 2022-06-11 NOTE — Progress Notes (Signed)
Established Patient Office Visit  Subjective   Patient ID: Desiree Valencia, female   DOB: [birth year] Age: 57 y.o. MRN: [id number]   Chief Complaint  Patient presents with   Follow-up   CALCIFICATION   HPI Pleasant 57 year old female presenting today to discuss calcifications seen on her recent panoramic dental x-rays. She does not have copies of the x-ray or dental notes but reports that he indicated the calcification were in the upper neck. Has been experiencing episodes of intermittent palpitaitons, fatigue, nausea, and dizziness that do  not seem to correlate to activity/rest. Notes the palpitations are worse when anxious. Also has noted some intermittent chest heaviness and arm heaviness. These episodes have been happening since she had COVID in 2020 but she has been rationalizing them away with various excuses. Denies vomiting, DOE, syncope, and falls. Known esophageal spasms. Was able to do a 5k recently without chest pain, shortness of breath, dizziness, etc.    Objective:    Vitals:   06/11/22 0909  BP: 128/83  Pulse: 91  Resp: 20  Height: 5\' 6"  (1.676 m)  Weight: 250 lb 9.6 oz (113.7 kg)  SpO2: 94%  BMI (Calculated): 40.47    Physical Exam Vitals and nursing note reviewed.  Constitutional:      General: She is not in acute distress.    Appearance: Normal appearance. She is obese. She is not ill-appearing.  HENT:     Head: Normocephalic and atraumatic.  Cardiovascular:     Rate and Rhythm: Normal rate and regular rhythm.     Pulses: Normal pulses.          Carotid pulses are 2+ on the right side and 2+ on the left side.      Radial pulses are 2+ on the right side and 2+ on the left side.       Posterior tibial pulses are 2+ on the right side and 2+ on the left side.     Heart sounds: Normal heart sounds.  Pulmonary:     Effort: Pulmonary effort is normal. No respiratory distress.     Breath sounds: Normal breath sounds. No wheezing, rhonchi or rales.   Musculoskeletal:     Right lower leg: No edema.     Left lower leg: No edema.  Skin:    General: Skin is warm and dry.  Neurological:     Mental Status: She is alert and oriented to person, place, and time.  Psychiatric:        Mood and Affect: Mood normal.        Behavior: Behavior normal.        Thought Content: Thought content normal.        Judgment: Judgment normal.   No results found for this or any previous visit (from the past 24 hour(s)).     The 10-year ASCVD risk score (Arnett DK, et al., 2019) is: 3.5%   Values used to calculate the score:     Age: 59 years     Sex: Female     Is Non-Hispanic African American: No     Diabetic: No     Tobacco smoker: No     Systolic Blood Pressure: 128 mmHg     Is BP treated: Yes     HDL Cholesterol: 43 mg/dL     Total Cholesterol: 173 mg/dL   Assessment & Plan:   1. Palpitations 2. Chest heaviness 3. Dizziness With calcifications seen on x-ray, feel that this would be related to  carotid artery concerns. Getting bilateral US today. With her history of palpitations increasing in frequency to nearly daily, plan for long term monitor. In office EKG today showing NSR, normal axis, rate 81. Discussed heart health and recommendations for cholesterol management and dietary/lifestyle changes. She is interested in pursuing CT coronary calcium so ordering today. She is aware that it will cost $99 out of pocket to have this completed.  - US Carotid Bilateral; Future - LONG TERM MONITOR (3-14 DAYS); Future - EKG 12-Lead - CT CARDIAC SCORING (SELF PAY ONLY); Future  Return if symptoms worsen or fail to improve.  ___________________________________________ Clearnce Sorrel, DNP, APRN, FNP-BC Primary Care and [place]

## 2022-06-11 NOTE — Progress Notes (Unsigned)
Enrolled for [employer] to mail a ZIO XT long term holter monitor to the patients address on file.   DOD to read. 

## 2022-06-13 ENCOUNTER — Ambulatory Visit (INDEPENDENT_AMBULATORY_CARE_PROVIDER_SITE_OTHER): Payer: Self-pay

## 2022-06-13 ENCOUNTER — Ambulatory Visit (INDEPENDENT_AMBULATORY_CARE_PROVIDER_SITE_OTHER): Payer: BC Managed Care – PPO

## 2022-06-13 DIAGNOSIS — R0789 Other chest pain: Secondary | ICD-10-CM

## 2022-06-13 DIAGNOSIS — I515 Myocardial degeneration: Secondary | ICD-10-CM

## 2022-06-13 DIAGNOSIS — R002 Palpitations: Secondary | ICD-10-CM

## 2022-06-13 DIAGNOSIS — R42 Dizziness and giddiness: Secondary | ICD-10-CM

## 2022-06-13 DIAGNOSIS — I10 Essential (primary) hypertension: Secondary | ICD-10-CM

## 2022-06-13 DIAGNOSIS — I6523 Occlusion and stenosis of bilateral carotid arteries: Secondary | ICD-10-CM | POA: Diagnosis not present

## 2022-06-14 ENCOUNTER — Encounter: Payer: Self-pay | Admitting: Medical-Surgical

## 2022-06-14 DIAGNOSIS — I6523 Occlusion and stenosis of bilateral carotid arteries: Secondary | ICD-10-CM

## 2022-06-14 MED ORDER — ROSUVASTATIN CALCIUM 5 MG PO TABS: 5.0000 mg | ORAL_TABLET | Freq: Every day | ORAL | 3 refills | Status: DC

## 2022-07-03 DIAGNOSIS — R002 Palpitations: Secondary | ICD-10-CM | POA: Diagnosis not present

## 2022-07-03 DIAGNOSIS — R42 Dizziness and giddiness: Secondary | ICD-10-CM | POA: Diagnosis not present

## 2022-07-08 ENCOUNTER — Other Ambulatory Visit: Payer: Self-pay | Admitting: Medical-Surgical

## 2022-07-09 ENCOUNTER — Encounter: Payer: Self-pay | Admitting: Medical-Surgical

## 2022-07-16 ENCOUNTER — Other Ambulatory Visit: Payer: Self-pay | Admitting: Medical-Surgical

## 2022-08-08 ENCOUNTER — Encounter: Payer: Self-pay | Admitting: Medical-Surgical

## 2022-08-08 ENCOUNTER — Ambulatory Visit: Payer: BC Managed Care – PPO | Admitting: Medical-Surgical

## 2022-08-08 VITALS — BP 133/87 | HR 75 | Ht 66.0 in | Wt 250.1 lb

## 2022-08-08 DIAGNOSIS — I10 Essential (primary) hypertension: Secondary | ICD-10-CM | POA: Diagnosis not present

## 2022-08-08 DIAGNOSIS — Z9189 Other specified personal risk factors, not elsewhere classified: Secondary | ICD-10-CM | POA: Diagnosis not present

## 2022-08-08 DIAGNOSIS — R002 Palpitations: Secondary | ICD-10-CM | POA: Diagnosis not present

## 2022-08-08 LAB — COMPLETE METABOLIC PANEL WITH GFR
AG Ratio: 1.7 (calc) (ref 1.0–2.5)
ALT: 21 U/L (ref 6–29)
AST: 20 U/L (ref 10–35)
Albumin: 4.5 g/dL (ref 3.6–5.1)
Alkaline phosphatase (APISO): 51 U/L (ref 37–153)
BUN: 17 mg/dL (ref 7–25)
CO2: 29 mmol/L (ref 20–32)
Calcium: 10 mg/dL (ref 8.6–10.4)
Chloride: 106 mmol/L (ref 98–110)
Creat: 0.9 mg/dL (ref 0.50–1.03)
Globulin: 2.6 g/dL (calc) (ref 1.9–3.7)
Glucose, Bld: 94 mg/dL (ref 65–99)
Potassium: 4.4 mmol/L (ref 3.5–5.3)
Sodium: 142 mmol/L (ref 135–146)
Total Bilirubin: 0.7 mg/dL (ref 0.2–1.2)
Total Protein: 7.1 g/dL (ref 6.1–8.1)
eGFR: 75 mL/min/{1.73_m2} (ref >=60)

## 2022-08-08 LAB — LIPID PANEL
Cholesterol: 112 mg/dL (ref <200)
HDL: 45 mg/dL — ABNORMAL LOW (ref >=50)
LDL Cholesterol (Calc): 50 mg/dL (calc)
Non-HDL Cholesterol (Calc): 67 mg/dL (calc) (ref <130)
Total CHOL/HDL Ratio: 2.5 (calc) (ref <5.0)
Triglycerides: 85 mg/dL (ref <150)

## 2022-08-08 NOTE — Progress Notes (Signed)
Established Patient Office Visit  Subjective   Patient ID: Desiree Valencia, female   DOB: 1965-07-04 Age: 57 y.o. MRN: [id number]   Chief Complaint  Patient presents with   Follow-up    Pt is following up on rosuvastatin.Pt reports tolerating well , no missed doses Pt states she fasting for labs.   HPI Pleasant 57 year old female presenting today for follow-up on:  Palpitations: Completed her long-term monitor with no significant cardiac arrhythmias or concerns.  Of note, she has been talking with her husband and they noticed that the event markers that were placed on the long-term monitor occurred mostly when she was talking to family members on the phone.  Thinks that there may be a component of anxiety/stress to the palpitations.  Since she started Toprol-XL, she reports that she has felt calm overall and not had many issues with palpitations.  Started Crestor 5 mg daily and has been doing well on this.  She notes that she is also made some changes in her diet.  She does have a bit of a dry mouth with the medication however she is drinking more water.  Avoiding alcohol except for the occasional indulgence.  Walking for exercise at the park with a goal for weight loss to healthy weight.  Denies muscle tenderness, unusual fatigue, and darkened urine.   Objective:    Vitals:   08/08/22 0857  BP: 133/87  Pulse: 75  Height: '5\' 6"'$  (1.676 m)  Weight: 250 lb 1.9 oz (113.5 kg)  SpO2: 99%  BMI (Calculated): 40.39    Physical Exam Vitals reviewed.  Constitutional:      General: She is not in acute distress.    Appearance: Normal appearance. She is not ill-appearing.  HENT:     Head: Normocephalic and atraumatic.  Cardiovascular:     Rate and Rhythm: Normal rate and regular rhythm.     Pulses: Normal pulses.     Heart sounds: Normal heart sounds.  Pulmonary:     Effort: Pulmonary effort is normal. No respiratory distress.     Breath sounds: Normal breath sounds. No wheezing, rhonchi  or rales.  Skin:    General: Skin is warm and dry.  Neurological:     Mental Status: She is alert and oriented to person, place, and time.  Psychiatric:        Mood and Affect: Mood normal.        Behavior: Behavior normal.        Thought Content: Thought content normal.        Judgment: Judgment normal.   No results found for this or any previous visit (from the past 24 hour(s)).     The 10-year ASCVD risk score (Arnett DK, et al., 2019) is: 3.8%   Values used to calculate the score:     Age: 32 years     Sex: Female     Is Non-Hispanic African American: No     Diabetic: No     Tobacco smoker: No     Systolic Blood Pressure: [id number] mmHg     Is BP treated: Yes     HDL Cholesterol: 43 mg/dL     Total Cholesterol: 173 mg/dL   Assessment & Plan:   1. At high risk for coronary artery disease Checking labs as below.  Continue rosuvastatin 5 mg daily.  Continue diet and lifestyle modifications. - COMPLETE METABOLIC PANEL WITH GFR - Lipid panel  2. Palpitations Improved.  Continue Toprol XL 25 mg daily.  Return in about 6 months (around 02/08/2023) for HTN follow up.  ___________________________________________ Clearnce Sorrel, DNP, APRN, FNP-BC Primary Care and [place]

## 2022-08-27 IMAGING — US US THYROID
1 series · 12 of 25 positions shown · non-contrast
Comparison: 08/17/2021

CLINICAL DATA: 56-year-old female with multiple bilateral thyroid
nodules

EXAM:
THYROID ULTRASOUND
TECHNIQUE: Ultrasound examination of the thyroid gland and adjacent soft
tissues was performed.

[Series 1: us thyroid · 12 of 70 slices shown]
[im 3/70]
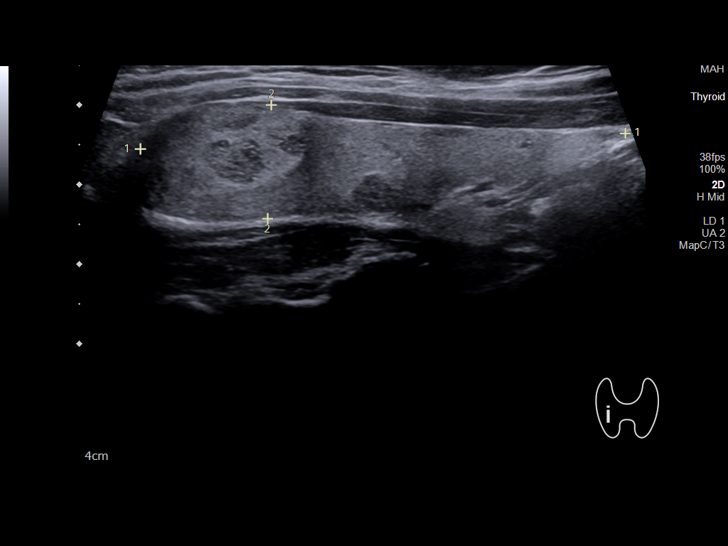
[im 9/70]
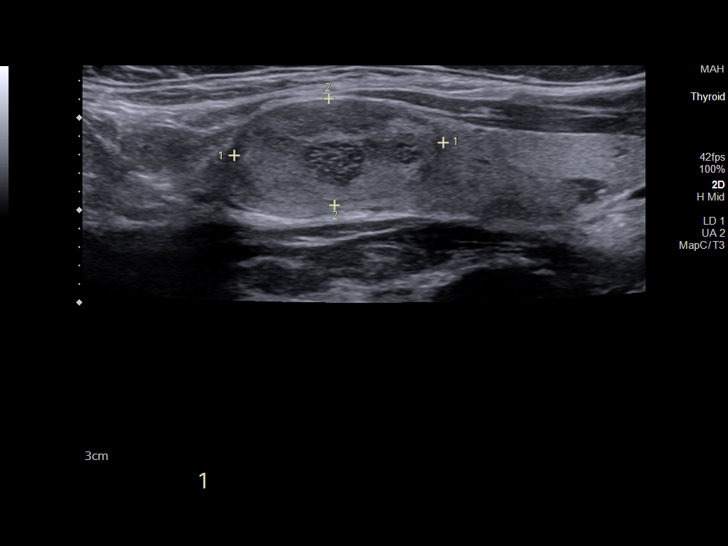
[im 15/70]
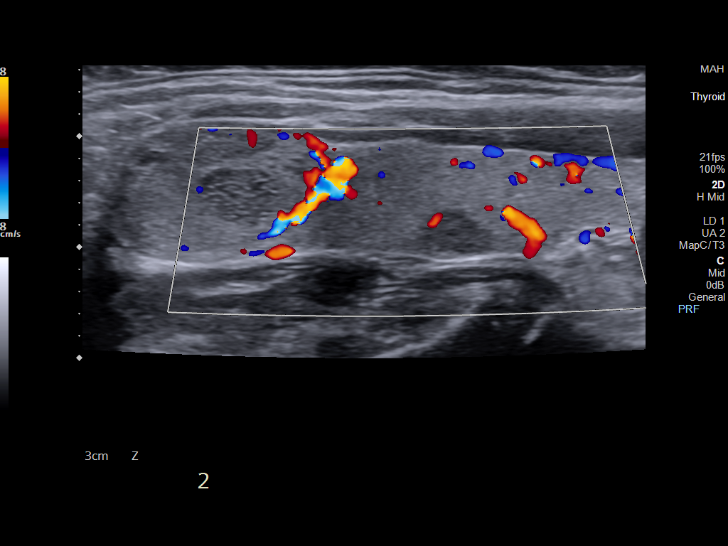
[im 21/70]
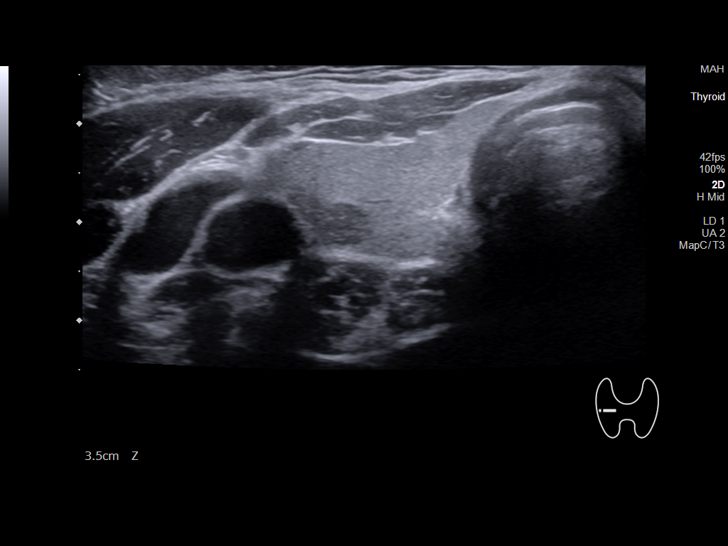
[im 26/70]
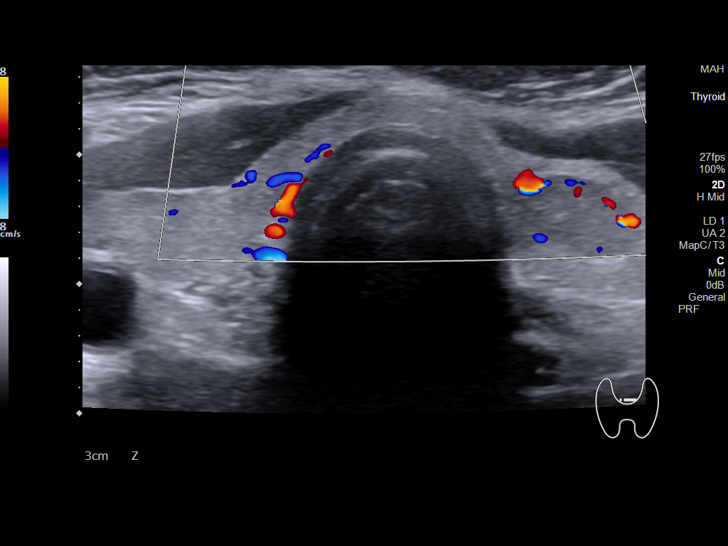
[im 32/70]
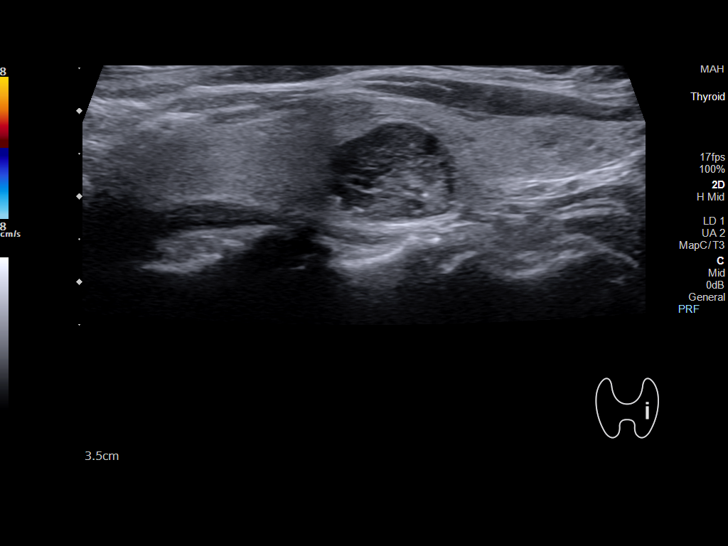
[im 38/70]
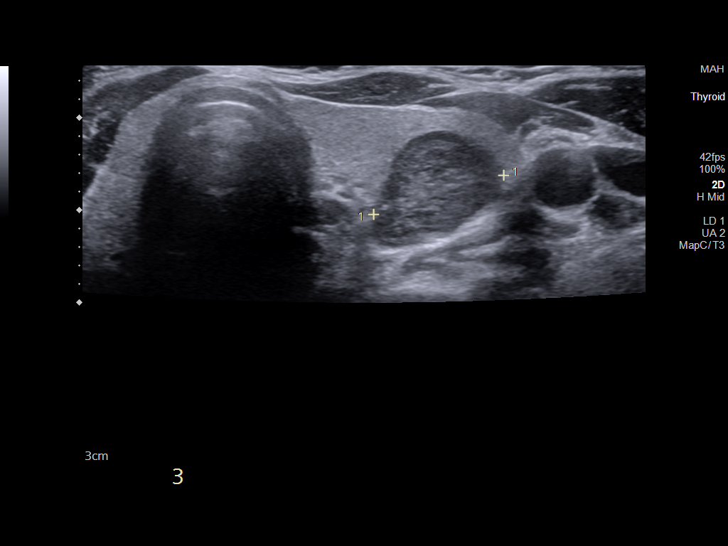
[im 44/70]
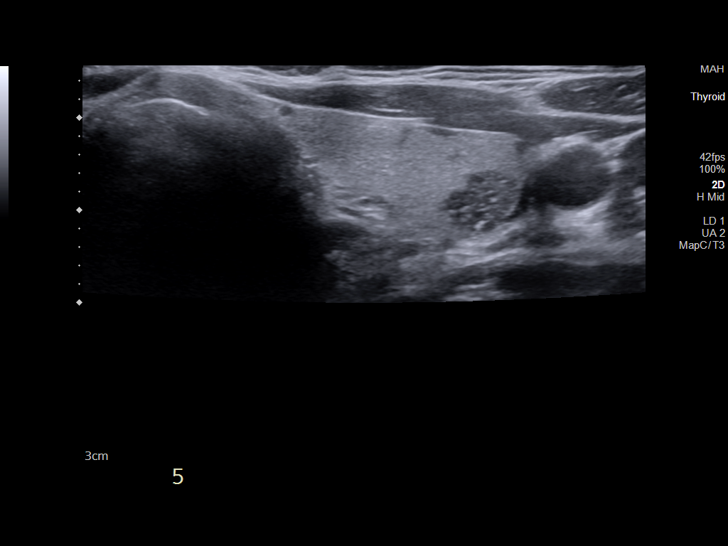
[im 49/70]
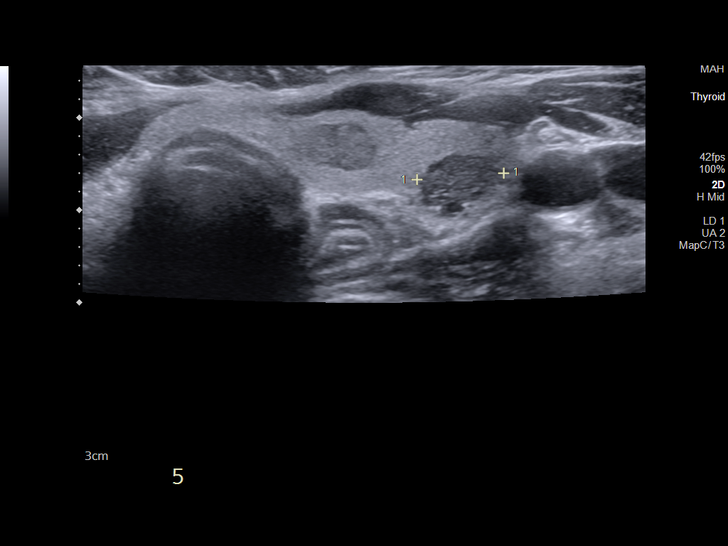
[im 55/70]
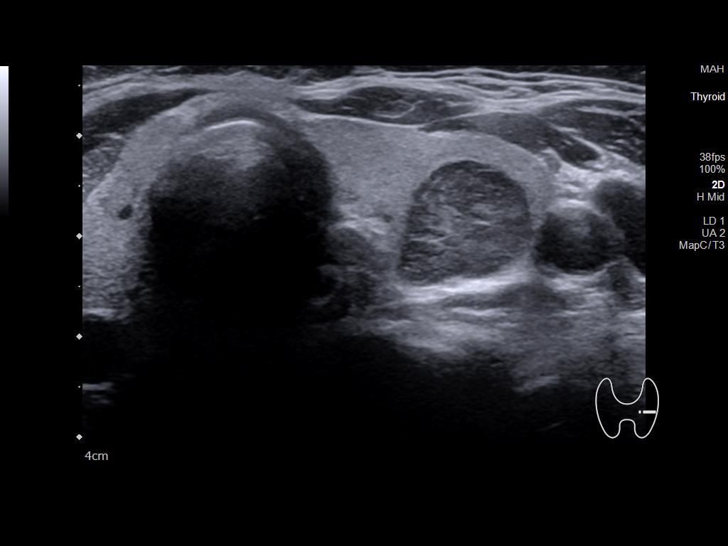
[im 61/70]
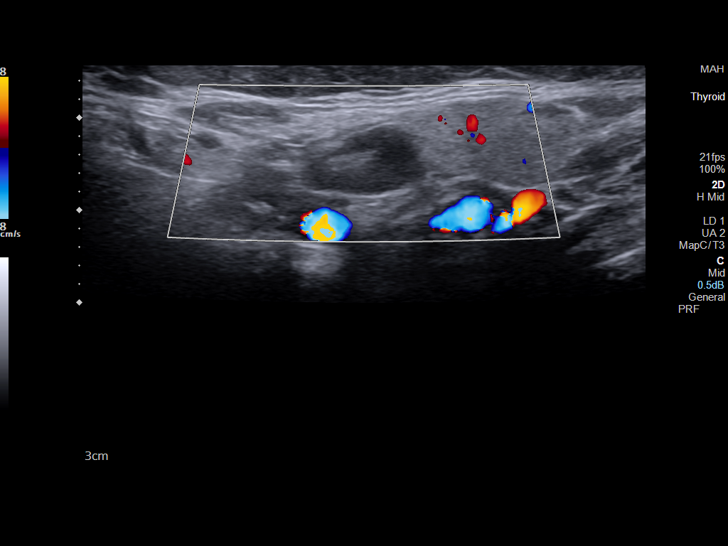
[im 67/70]
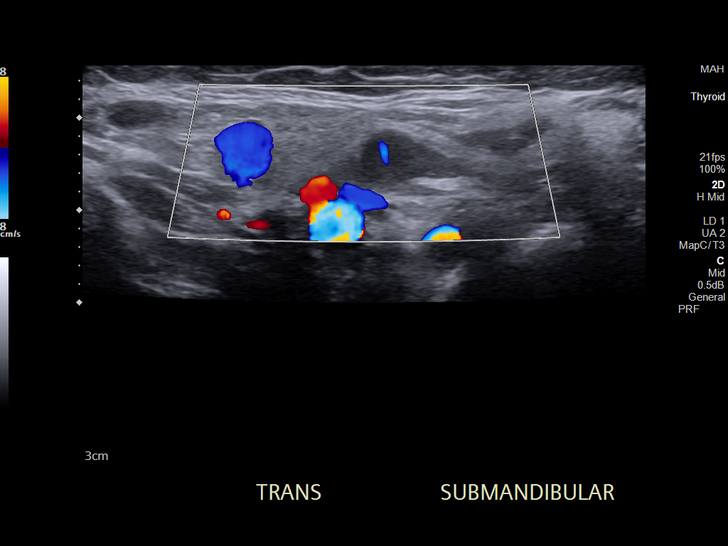

[12 of 25 positions shown; findings below may reference images not displayed]

FINDINGS: Parenchymal Echotexture: Mildly heterogenous

Isthmus: 0.3 cm

Right lobe: 6.1 cm x 1.4 cm x 2.1 cm

Left lobe: 5.9 cm x 1.7 cm x 2.2 cm

_________________________________________________________

Estimated total number of nodules >/= 1 cm: 4

Number of spongiform nodules >/=  2 cm not described below (TR1): 0

Number of mixed cystic and solid nodules >/= 1.5 cm not described
below (TR2): 0

_________________________________________________________

Nodule # 1:

Location: Right; Superior

Maximum size: 2.3 cm; Other 2 dimensions: 1.4 cm x 1.2 cm

Composition: solid/almost completely solid (2)

Echogenicity: isoechoic (1)

Shape: not taller-than-wide (0)

Margins: smooth (0)

Echogenic foci: none (0)

ACR TI-RADS total points: 3.

ACR TI-RADS risk category: TR3 (3 points).

ACR TI-RADS recommendations:

Nodule meets criteria for surveillance

_________________________________________________________

Nodule # 2:

Location: Right; Inferior

Maximum size: 0.7 cm; Other 2 dimensions: 0.6 cm x 0.4 cm

Composition: cannot determine (2)

Echogenicity: hypoechoic (2)

Shape: not taller-than-wide (0)

Margins: ill-defined (0)

Echogenic foci: none (0)

ACR TI-RADS total points: 4.

ACR TI-RADS risk category: TR4 (4-6 points).

ACR TI-RADS recommendations:

Nodule does not meet criteria for surveillance or biopsy

_________________________________________________________

Nodule # 3:

Location: Left; Mid

Maximum size: 1.7 cm; Other 2 dimensions: 1.5 cm x 1.2 cm

Composition: spongiform (0)

ACR TI-RADS recommendations:

Spongiform nodule does not meet criteria for surveillance or biopsy

_________________________________________________________

Nodule # 4:

Location: Left; Inferior

Maximum size: 1.4 cm; Other 2 dimensions: 0.9 cm x 0.6 cm

Composition: solid/almost completely solid (2)

Echogenicity: hypoechoic (2)

Shape: not taller-than-wide (0)

Margins: smooth (0)

Echogenic foci: none (0)

ACR TI-RADS total points: 4.

ACR TI-RADS risk category: TR4 (4-6 points).

ACR TI-RADS recommendations:

Nodule meets criteria for surveillance

_________________________________________________________

Nodule # 5:

Location: Left; Mid

Maximum size: 1.0 cm; Other 2 dimensions: 0.9 cm x 0.8 cm

Composition: spongiform (0)

ACR TI-RADS recommendations:

Spongiform nodule does not meet criteria for surveillance or biopsy

_________________________________________________________

No adenopathy
IMPRESSION: Multinodular thyroid.

Right superior thyroid nodule (labeled 1, 2.3 cm, TR 3) and the left
inferior thyroid nodule (labeled 4, 1.4 cm, TR 4) both meet criteria
for surveillance, as designated by the newly established ACR TI-RADS
criteria. Surveillance ultrasound study recommended to be performed
annually up to 5 years.

Recommendations follow those established by the new ACR TI-RADS
criteria ([HOSPITAL] 5348;[DATE]).

## 2022-09-26 ENCOUNTER — Ambulatory Visit: Payer: BC Managed Care – PPO | Admitting: Medical-Surgical

## 2022-10-04 ENCOUNTER — Other Ambulatory Visit: Payer: Self-pay | Admitting: Medical-Surgical

## 2022-10-12 ENCOUNTER — Other Ambulatory Visit: Payer: Self-pay | Admitting: Medical-Surgical

## 2022-12-28 ENCOUNTER — Ambulatory Visit: Payer: BC Managed Care – PPO | Admitting: Medical-Surgical

## 2022-12-28 ENCOUNTER — Ambulatory Visit (INDEPENDENT_AMBULATORY_CARE_PROVIDER_SITE_OTHER): Payer: BC Managed Care – PPO

## 2022-12-28 VITALS — BP 138/78 | HR 79 | Resp 20 | Ht 65.55 in | Wt 244.4 lb

## 2022-12-28 DIAGNOSIS — M542 Cervicalgia: Secondary | ICD-10-CM | POA: Diagnosis not present

## 2022-12-28 DIAGNOSIS — M546 Pain in thoracic spine: Secondary | ICD-10-CM

## 2022-12-28 DIAGNOSIS — M47814 Spondylosis without myelopathy or radiculopathy, thoracic region: Secondary | ICD-10-CM | POA: Diagnosis not present

## 2022-12-28 DIAGNOSIS — M5030 Other cervical disc degeneration, unspecified cervical region: Secondary | ICD-10-CM | POA: Diagnosis not present

## 2022-12-28 DIAGNOSIS — M47812 Spondylosis without myelopathy or radiculopathy, cervical region: Secondary | ICD-10-CM | POA: Diagnosis not present

## 2022-12-28 DIAGNOSIS — M5134 Other intervertebral disc degeneration, thoracic region: Secondary | ICD-10-CM | POA: Diagnosis not present

## 2022-12-28 MED ORDER — PREDNISONE 50 MG PO TABS: 50.0000 mg | ORAL_TABLET | Freq: Every day | ORAL | 0 refills | Status: DC

## 2022-12-28 MED ORDER — CYCLOBENZAPRINE HCL 10 MG PO TABS: 5.0000 mg | ORAL_TABLET | Freq: Three times a day (TID) | ORAL | 1 refills | Status: DC | PRN

## 2022-12-28 NOTE — Progress Notes (Unsigned)
        Established patient visit  History, exam, impression, and plan:  1. Neck pain 2. Acute midline thoracic back pain Pleasant 57 year old female presenting today with reports of nearly a month of neck and back pain that started on 6/28 when she was gardening. She was pulling weeds when she felt a pop between her shoulder blades. Pain is sharp and stabbing when she reaches up and away from her body. Radiates up from her mid back to the neck and shoulders. Had some tingling in her fingers last night but no other weakness, temperature changes, color changes, or paresthesias noted. Has found herself slouching a lot since this helps with the pain. Tried heat, ice, and rest without much relief. On exam, muscle strength 5/5 to BUE. Full ROM to neck and upper extremities. No midline pain/tenderness. Thoracic and cervical paraspinal muscles very tight/tense and tender. Treating with prednisone burst x 5 days and Flexeril. X-rays today. Referring to PT. Continue conservative measures at home.  - DG Cervical Spine Complete; Future - DG Thoracic Spine W/Swimmers; Future - Ambulatory referral to Physical Therapy  Procedures performed this visit: None.  Return for back/neck pain follow up in 6 weeks if no improvement.  __________________________________ Thayer Ohm, DNP, APRN, FNP-BC Primary Care and Sports Medicine [facility] [place]

## 2023-01-01 ENCOUNTER — Ambulatory Visit: Payer: BC Managed Care – PPO | Admitting: Physical Therapy

## 2023-01-03 ENCOUNTER — Encounter: Payer: Self-pay | Admitting: Medical-Surgical

## 2023-01-08 ENCOUNTER — Other Ambulatory Visit: Payer: Self-pay | Admitting: Medical-Surgical

## 2023-01-15 ENCOUNTER — Encounter: Payer: Self-pay | Admitting: Medical-Surgical

## 2023-01-22 DIAGNOSIS — M546 Pain in thoracic spine: Secondary | ICD-10-CM | POA: Diagnosis not present

## 2023-01-22 DIAGNOSIS — M542 Cervicalgia: Secondary | ICD-10-CM | POA: Diagnosis not present

## 2023-01-24 ENCOUNTER — Other Ambulatory Visit (HOSPITAL_COMMUNITY)
Admission: RE | Admit: 2023-01-24 | Discharge: 2023-01-24 | Disposition: A | Discharge status: Home / Self Care | Payer: BC Managed Care – PPO | Source: Ambulatory Visit | Attending: Medical-Surgical | Admitting: Medical-Surgical

## 2023-01-24 ENCOUNTER — Ambulatory Visit: Payer: BC Managed Care – PPO | Admitting: Medical-Surgical

## 2023-01-24 ENCOUNTER — Encounter: Payer: Self-pay | Admitting: Medical-Surgical

## 2023-01-24 VITALS — BP 140/87 | HR 86 | Ht 65.0 in | Wt 249.1 lb

## 2023-01-24 DIAGNOSIS — Z124 Encounter for screening for malignant neoplasm of cervix: Secondary | ICD-10-CM

## 2023-01-24 DIAGNOSIS — Z23 Encounter for immunization: Secondary | ICD-10-CM | POA: Diagnosis not present

## 2023-01-24 NOTE — Progress Notes (Signed)
        Established patient visit  History, exam, impression, and plan:  1. Cervical cancer screening Pleasant 57 year old female presenting today to complete cervical cancer screening.  Her last 1 was completed in June 2019 with normal results.  Denies any abnormal results in the past.  Denies any concerning symptoms or concern for STI testing.  Pap smear completed with HPV cotesting.  Patient tolerated well.  If normal, repeat in 5 years. - Cytology - PAP  2. Need for tetanus booster Tdap given in office today. - Tdap vaccine greater than or equal to 7yo IM  3. Need for influenza vaccination Flu vaccine given in office today.  Physical Exam Exam conducted with a chaperone present.  Constitutional:      General: She is not in acute distress.    Appearance: Normal appearance. She is obese. She is not ill-appearing.  HENT:     Head: Normocephalic and atraumatic.  Cardiovascular:     Rate and Rhythm: Normal rate.  Pulmonary:     Effort: Pulmonary effort is normal. No respiratory distress.  Abdominal:     General: There is no distension.     Palpations: Abdomen is soft.     Tenderness: There is no abdominal tenderness.  Genitourinary:    Exam position: Lithotomy position.     Vagina: Normal.     Cervix: Normal.     Uterus: Absent.      Adnexa: Right adnexa normal and left adnexa normal.  Skin:    General: Skin is warm and dry.  Neurological:     Mental Status: She is alert and oriented to person, place, and time.  Psychiatric:        Mood and Affect: Mood normal.        Behavior: Behavior normal.        Thought Content: Thought content normal.        Judgment: Judgment normal.   Procedures performed this visit: None.  Return if symptoms worsen or fail to improve.  __________________________________ Thayer Ohm, DNP, APRN, FNP-BC Primary Care and Sports Medicine [facility]

## 2023-01-25 DIAGNOSIS — M546 Pain in thoracic spine: Secondary | ICD-10-CM | POA: Diagnosis not present

## 2023-01-25 DIAGNOSIS — M542 Cervicalgia: Secondary | ICD-10-CM | POA: Diagnosis not present

## 2023-01-25 LAB — CYTOLOGY - PAP
Adequacy: ABSENT
Comment: NEGATIVE
Diagnosis: NEGATIVE
High risk HPV: NEGATIVE

## 2023-01-28 DIAGNOSIS — Z23 Encounter for immunization: Secondary | ICD-10-CM | POA: Diagnosis not present

## 2023-01-28 NOTE — Addendum Note (Signed)
Addended by: Rae Halsted on: 01/28/2023 04:48 PM   Modules accepted: Orders

## 2023-01-29 DIAGNOSIS — M542 Cervicalgia: Secondary | ICD-10-CM | POA: Diagnosis not present

## 2023-01-29 DIAGNOSIS — M546 Pain in thoracic spine: Secondary | ICD-10-CM | POA: Diagnosis not present

## 2023-02-01 DIAGNOSIS — M542 Cervicalgia: Secondary | ICD-10-CM | POA: Diagnosis not present

## 2023-02-01 DIAGNOSIS — M546 Pain in thoracic spine: Secondary | ICD-10-CM | POA: Diagnosis not present

## 2023-02-04 DIAGNOSIS — M542 Cervicalgia: Secondary | ICD-10-CM | POA: Diagnosis not present

## 2023-02-04 DIAGNOSIS — M546 Pain in thoracic spine: Secondary | ICD-10-CM | POA: Diagnosis not present

## 2023-02-07 DIAGNOSIS — M546 Pain in thoracic spine: Secondary | ICD-10-CM | POA: Diagnosis not present

## 2023-02-07 DIAGNOSIS — M542 Cervicalgia: Secondary | ICD-10-CM | POA: Diagnosis not present

## 2023-02-08 ENCOUNTER — Ambulatory Visit: Payer: BC Managed Care – PPO | Admitting: Medical-Surgical

## 2023-02-08 ENCOUNTER — Encounter: Payer: Self-pay | Admitting: Medical-Surgical

## 2023-02-08 VITALS — BP 121/77 | HR 76 | Resp 20 | Ht 65.0 in | Wt 249.0 lb

## 2023-02-08 DIAGNOSIS — I10 Essential (primary) hypertension: Secondary | ICD-10-CM | POA: Diagnosis not present

## 2023-02-08 DIAGNOSIS — E042 Nontoxic multinodular goiter: Secondary | ICD-10-CM

## 2023-02-08 MED ORDER — HYDROCHLOROTHIAZIDE 12.5 MG PO TABS: 12.5000 mg | ORAL_TABLET | Freq: Every day | ORAL | 3 refills | Status: DC

## 2023-02-08 NOTE — Progress Notes (Signed)
        Established patient visit  History, exam, impression, and plan:  1. Benign essential hypertension Pleasant 57 year old female presenting today with a history of hypertension currently being managed with losartan 100 mg daily and metoprolol 25 mg daily.  Tolerating both medications well without side effects.  Has been checking blood pressure at home but reports that it runs in the 130s/70s-80s and she would like to have it better controlled.  Working to follow a low-sodium diet and trying to increase regular intentional exercise.  Cardiopulmonary exam normal today.  Denies concerning symptoms.  Checking labs as below.  Blood pressure elevated on arrival but recheck looks good.  Because she is having periodic issues with lower extremity edema and higher than goal blood pressures at home, adding hydrochlorothiazide 12.5 mg daily.  Continue losartan and Toprol-XL.  Plan to return in 2 weeks for nurse visit to evaluate tolerance and response. - CBC with Differential/Platelet - Comprehensive metabolic panel - Lipid panel - hydrochlorothiazide (HYDRODIURIL) 12.5 MG tablet; Take 1 tablet (12.5 mg total) by mouth daily.  Dispense: 30 tablet; Refill: 3  2. Multiple thyroid nodules History of multiple thyroid nodules but not currently being treated.  We are checking TSH today. - TSH   Procedures performed this visit: None.  Return in about 2 weeks (around 02/22/2023) for nurse visit for BP check.  __________________________________ Thayer Ohm, DNP, APRN, FNP-BC Primary Care and Sports Medicine [facility] [place]

## 2023-02-09 LAB — CBC WITH DIFFERENTIAL/PLATELET
Basophils Absolute: 0 10*3/uL (ref 0.0–0.2)
Basos: 1 %
EOS (ABSOLUTE): 0.1 10*3/uL (ref 0.0–0.4)
Eos: 2 %
Hematocrit: 46.1 % (ref 34.0–46.6)
Hemoglobin: 14.8 g/dL (ref 11.1–15.9)
Immature Grans (Abs): 0 10*3/uL (ref 0.0–0.1)
Immature Granulocytes: 0 %
Lymphocytes Absolute: 2 10*3/uL (ref 0.7–3.1)
Lymphs: 36 %
MCH: 31.7 pg (ref 26.6–33.0)
MCHC: 32.1 g/dL (ref 31.5–35.7)
MCV: 99 fL — ABNORMAL HIGH (ref 79–97)
Monocytes Absolute: 0.4 10*3/uL (ref 0.1–0.9)
Monocytes: 6 %
Neutrophils Absolute: 3 10*3/uL (ref 1.4–7.0)
Neutrophils: 55 %
Platelets: 259 10*3/uL (ref 150–450)
RBC: 4.67 x10E6/uL (ref 3.77–5.28)
RDW: 13.2 % (ref 11.7–15.4)
WBC: 5.6 10*3/uL (ref 3.4–10.8)

## 2023-02-09 LAB — COMPREHENSIVE METABOLIC PANEL
ALT: 20 IU/L (ref 0–32)
AST: 20 IU/L (ref 0–40)
Albumin: 4.2 g/dL (ref 3.8–4.9)
Alkaline Phosphatase: 64 IU/L (ref 44–121)
BUN/Creatinine Ratio: 13 (ref 9–23)
BUN: 11 mg/dL (ref 6–24)
Bilirubin Total: 0.6 mg/dL (ref 0.0–1.2)
CO2: 23 mmol/L (ref 20–29)
Calcium: 9.6 mg/dL (ref 8.7–10.2)
Chloride: 106 mmol/L (ref 96–106)
Creatinine, Ser: 0.85 mg/dL (ref 0.57–1.00)
Globulin, Total: 2.2 g/dL (ref 1.5–4.5)
Glucose: 87 mg/dL (ref 70–99)
Potassium: 4.2 mmol/L (ref 3.5–5.2)
Sodium: 140 mmol/L (ref 134–144)
Total Protein: 6.4 g/dL (ref 6.0–8.5)
eGFR: 79 mL/min/{1.73_m2} (ref >59)

## 2023-02-09 LAB — LIPID PANEL
Chol/HDL Ratio: 2.9 ratio (ref 0.0–4.4)
Cholesterol, Total: 119 mg/dL (ref 100–199)
HDL: 41 mg/dL (ref >39)
LDL Chol Calc (NIH): 56 mg/dL (ref 0–99)
Triglycerides: 125 mg/dL (ref 0–149)
VLDL Cholesterol Cal: 22 mg/dL (ref 5–40)

## 2023-02-09 LAB — TSH: TSH: 0.848 u[IU]/mL (ref 0.450–4.500)

## 2023-02-18 DIAGNOSIS — M542 Cervicalgia: Secondary | ICD-10-CM | POA: Diagnosis not present

## 2023-02-18 DIAGNOSIS — M546 Pain in thoracic spine: Secondary | ICD-10-CM | POA: Diagnosis not present

## 2023-02-21 DIAGNOSIS — M542 Cervicalgia: Secondary | ICD-10-CM | POA: Diagnosis not present

## 2023-02-21 DIAGNOSIS — M546 Pain in thoracic spine: Secondary | ICD-10-CM | POA: Diagnosis not present

## 2023-02-22 ENCOUNTER — Ambulatory Visit (INDEPENDENT_AMBULATORY_CARE_PROVIDER_SITE_OTHER): Payer: BC Managed Care – PPO | Admitting: Medical-Surgical

## 2023-02-22 VITALS — BP 121/71 | HR 78 | Ht 65.0 in | Wt 244.0 lb

## 2023-02-22 DIAGNOSIS — I10 Essential (primary) hypertension: Secondary | ICD-10-CM

## 2023-02-22 NOTE — Progress Notes (Signed)
Pt presents to clinic today for BP check.   Denies any cp/sob/palpitaions/headaches/dizziness or swelling.   She continues to monitor her BP at home.   She was advised to RTC in 6 months for f/u with pcp or sooner if her BP is either to low/high.

## 2023-02-27 ENCOUNTER — Other Ambulatory Visit: Payer: Self-pay | Admitting: Medical-Surgical

## 2023-03-07 DIAGNOSIS — Z860101 Personal history of adenomatous and serrated colon polyps: Secondary | ICD-10-CM | POA: Diagnosis not present

## 2023-03-07 DIAGNOSIS — Z09 Encounter for follow-up examination after completed treatment for conditions other than malignant neoplasm: Secondary | ICD-10-CM | POA: Diagnosis not present

## 2023-03-07 DIAGNOSIS — D122 Benign neoplasm of ascending colon: Secondary | ICD-10-CM | POA: Diagnosis not present

## 2023-03-29 ENCOUNTER — Other Ambulatory Visit: Payer: Self-pay | Admitting: Medical-Surgical

## 2023-04-03 ENCOUNTER — Other Ambulatory Visit: Payer: Self-pay | Admitting: Medical-Surgical

## 2023-04-03 ENCOUNTER — Telehealth: Payer: Self-pay | Admitting: Medical-Surgical

## 2023-04-03 DIAGNOSIS — Z1231 Encounter for screening mammogram for malignant neoplasm of breast: Secondary | ICD-10-CM

## 2023-04-03 MED ORDER — LOSARTAN POTASSIUM-HCTZ 100-12.5 MG PO TABS: 1.0000 | ORAL_TABLET | Freq: Every day | ORAL | 3 refills | Status: DC

## 2023-04-03 NOTE — Telephone Encounter (Signed)
Patient called she stated that she is doing well she needs a refill of  of Losartan 100mg  And hydrochlorothiazide 12.5mg  as a combination   Pharmacy:  [facility] [address] [place] [address]  Phone:: [phone]

## 2023-04-03 NOTE — Telephone Encounter (Signed)
Refill sent.

## 2023-04-04 ENCOUNTER — Other Ambulatory Visit: Payer: Self-pay | Admitting: Medical-Surgical

## 2023-05-04 ENCOUNTER — Other Ambulatory Visit: Payer: Self-pay | Admitting: Medical-Surgical

## 2023-05-22 ENCOUNTER — Ambulatory Visit: Payer: BC Managed Care – PPO

## 2023-05-22 DIAGNOSIS — Z1231 Encounter for screening mammogram for malignant neoplasm of breast: Secondary | ICD-10-CM

## 2023-05-24 ENCOUNTER — Other Ambulatory Visit: Payer: Self-pay | Admitting: Medical-Surgical

## 2023-05-24 DIAGNOSIS — I6523 Occlusion and stenosis of bilateral carotid arteries: Secondary | ICD-10-CM

## 2023-05-31 ENCOUNTER — Other Ambulatory Visit: Payer: Self-pay | Admitting: Medical-Surgical

## 2023-05-31 DIAGNOSIS — I6523 Occlusion and stenosis of bilateral carotid arteries: Secondary | ICD-10-CM

## 2023-07-30 ENCOUNTER — Other Ambulatory Visit: Payer: Self-pay

## 2023-07-30 MED ORDER — METOPROLOL SUCCINATE ER 25 MG PO TB24: ORAL_TABLET | ORAL | 0 refills | Status: DC

## 2023-08-22 ENCOUNTER — Ambulatory Visit: Payer: BC Managed Care – PPO | Admitting: Medical-Surgical

## 2023-08-22 ENCOUNTER — Encounter: Payer: Self-pay | Admitting: Medical-Surgical

## 2023-08-22 ENCOUNTER — Ambulatory Visit

## 2023-08-22 VITALS — BP 127/78 | HR 96 | Resp 20 | Ht 65.0 in | Wt 245.1 lb

## 2023-08-22 DIAGNOSIS — M5416 Radiculopathy, lumbar region: Secondary | ICD-10-CM

## 2023-08-22 DIAGNOSIS — M48061 Spinal stenosis, lumbar region without neurogenic claudication: Secondary | ICD-10-CM | POA: Diagnosis not present

## 2023-08-22 DIAGNOSIS — M419 Scoliosis, unspecified: Secondary | ICD-10-CM | POA: Diagnosis not present

## 2023-08-22 DIAGNOSIS — L03221 Cellulitis of neck: Secondary | ICD-10-CM | POA: Diagnosis not present

## 2023-08-22 DIAGNOSIS — M438X6 Other specified deforming dorsopathies, lumbar region: Secondary | ICD-10-CM | POA: Diagnosis not present

## 2023-08-22 DIAGNOSIS — R29898 Other symptoms and signs involving the musculoskeletal system: Secondary | ICD-10-CM | POA: Diagnosis not present

## 2023-08-22 DIAGNOSIS — I6523 Occlusion and stenosis of bilateral carotid arteries: Secondary | ICD-10-CM | POA: Diagnosis not present

## 2023-08-22 DIAGNOSIS — M47816 Spondylosis without myelopathy or radiculopathy, lumbar region: Secondary | ICD-10-CM | POA: Diagnosis not present

## 2023-08-22 DIAGNOSIS — M545 Low back pain, unspecified: Secondary | ICD-10-CM

## 2023-08-22 DIAGNOSIS — I10 Essential (primary) hypertension: Secondary | ICD-10-CM

## 2023-08-22 MED ORDER — ROSUVASTATIN CALCIUM 5 MG PO TABS: 5.0000 mg | ORAL_TABLET | Freq: Every day | ORAL | 3 refills | Status: DC

## 2023-08-22 MED ORDER — MELOXICAM 15 MG PO TABS: 15.0000 mg | ORAL_TABLET | Freq: Every day | ORAL | 3 refills | Status: DC

## 2023-08-22 MED ORDER — DOXYCYCLINE HYCLATE 100 MG PO TABS: 100.0000 mg | ORAL_TABLET | Freq: Two times a day (BID) | ORAL | 0 refills | Status: DC

## 2023-08-22 MED ORDER — METOPROLOL SUCCINATE ER 25 MG PO TB24: ORAL_TABLET | ORAL | 3 refills | Status: DC

## 2023-08-22 NOTE — Progress Notes (Unsigned)
 Established patient visit  History, exam, impression, and plan:  1. Benign essential hypertension (Primary) Very pleasant 58 year old female presenting today with a history of hypertension currently managed with Toprol-XL 25 mg daily and losartan-HCTZ 100-12.5 mg daily.  Tolerates both medications well without side effects.  Following a low-sodium heart healthy diet.  Has been doing physical therapy for neck and back issues but no significant outside exercise.Denies CP, SOB, palpitations, lower extremity edema, dizziness, headaches, or vision changes.  Cardiopulmonary exam is normal.  Blood pressure at goal.  Continue Toprol-XL and losartan-HCTZ as prescribed. - metoprolol succinate (TOPROL-XL) 25 MG 24 hr tablet; Take 1 tablet (25 mg total) by mouth daily.  Dispense: 90 tablet; Refill: 3  2. Carotid artery plaque, bilateral Has a history of carotid artery plaque.  With her CT coronary calcium score of 47.7, she was in the 88th percentile placing her at high risk for cardiovascular complications.  She was started on Crestor 5 mg daily and has been taking this, tolerating well without side effects.  Compliant with dosing.  Working to change her diet to allow for low fat, heart healthy choices.  Up-to-date on lipid checks.  Continue Crestor 5 mg daily as prescribed. - rosuvastatin (CRESTOR) 5 MG tablet; Take 1 tablet (5 mg total) by mouth daily.  Dispense: 90 tablet; Refill: 3  3. Cellulitis of neck Reports that she does yard work on a regular basis.  She was out spreading mulch on Saturday.  On Sunday morning, she woke up with a large, swollen, red, tender, and pruritic lesion on her right supraclavicular fossa.  She also had a knot on the side of her neck on the right side that is tender.  Feels that this was a bug bite but is not sure what may have bitten her.  Today she notes that the bug bite has gotten better in the central area overall.  It is scabbed over and now has some peeling skin  however the redness surrounding the wound has persisted without change.  The area is still slightly swollen and warm to touch.  The knot on the right side of her neck is firm, mobile, round, and tender to palpation.  Size estimated to be approximately 1 cm round.  Consistent with a swollen lymph node likely in reaction to infection/inflammation of the potential bug bite.  Concern for cellulitic changes given the redness that persist around the area.  She has not had any fevers, chills, or myalgias but with the size and tenderness of the lymph node, plan to treat for cellulitis with doxycycline twice daily x 7 days.  4. Lumbar radiculopathy 5. Weakness of both lower extremities Has a long history of low back pain along the midline that seems to have gotten worse.  She has aching of the bilateral low back axially.  Also notes aching in her legs when she is walking.  Often has sharp pains that shoot down into her legs when trying to rise from a seated position.  Reports subjective weakness of her lower extremities. Denies foot drop.  No red flag symptoms. On exam, BLE strength 5/5 through hips, knees, and ankles. Neurovascularly intact. No significant edema. Tenderness to palpation of the lumbar spine along the midline. Mild tenderness to the lumbar paraspinal muscles bilaterally. Getting x-rays today. Recommend working on physical therapy exercise. Consider anti-inflammatories.  - DG Lumbar Spine Complete; Future  Procedures performed this visit: None.  No follow-ups on file.  __________________________________ Ander Slade  Jolene Schimke, DNP, APRN, FNP-BC Primary Care and Sports Medicine [facility] [place]

## 2023-08-28 ENCOUNTER — Encounter: Payer: Self-pay | Admitting: Medical-Surgical

## 2023-08-28 MED ORDER — BUPROPION HCL ER (XL) 150 MG PO TB24: 150.0000 mg | ORAL_TABLET | Freq: Every day | ORAL | 1 refills | Status: DC

## 2023-09-04 ENCOUNTER — Encounter: Payer: Self-pay | Admitting: Medical-Surgical

## 2023-09-06 NOTE — Telephone Encounter (Signed)
 Mychart message sent by pt:  [name]  P [facility] (supporting Christen Butter, NP)11 hours ago (9:14 PM)    Hello [name] I am having concerns about the wellbutrin.  I am having difficulties with confusion, kinda like I am in a fog. I am having difficulty sleeping for more then a few hours. I am getting lightheaded at times and I am now super sun sensitive even with sunscreen. I don't feel like me any longer. Is it okay to stop and revisit the reasons for this medication at a later date? Thanks Desiree Valencia, please advise on this what you recommend.

## 2023-12-12 ENCOUNTER — Other Ambulatory Visit: Payer: Self-pay | Admitting: Medical-Surgical

## 2024-02-20 ENCOUNTER — Encounter: Payer: Self-pay | Admitting: Medical-Surgical

## 2024-02-20 ENCOUNTER — Ambulatory Visit: Admitting: Medical-Surgical

## 2024-02-20 VITALS — BP 123/81 | HR 79 | Resp 20 | Ht 65.0 in | Wt 242.0 lb

## 2024-02-20 DIAGNOSIS — K219 Gastro-esophageal reflux disease without esophagitis: Secondary | ICD-10-CM | POA: Diagnosis not present

## 2024-02-20 DIAGNOSIS — M5416 Radiculopathy, lumbar region: Secondary | ICD-10-CM | POA: Insufficient documentation

## 2024-02-20 DIAGNOSIS — E7800 Pure hypercholesterolemia, unspecified: Secondary | ICD-10-CM | POA: Insufficient documentation

## 2024-02-20 DIAGNOSIS — N289 Disorder of kidney and ureter, unspecified: Secondary | ICD-10-CM | POA: Diagnosis not present

## 2024-02-20 DIAGNOSIS — I6523 Occlusion and stenosis of bilateral carotid arteries: Secondary | ICD-10-CM | POA: Insufficient documentation

## 2024-02-20 DIAGNOSIS — Z23 Encounter for immunization: Secondary | ICD-10-CM

## 2024-02-20 DIAGNOSIS — I10 Essential (primary) hypertension: Secondary | ICD-10-CM

## 2024-02-20 DIAGNOSIS — E559 Vitamin D deficiency, unspecified: Secondary | ICD-10-CM

## 2024-02-20 DIAGNOSIS — E042 Nontoxic multinodular goiter: Secondary | ICD-10-CM

## 2024-02-20 MED ORDER — LOSARTAN POTASSIUM 100 MG PO TABS: 100.0000 mg | ORAL_TABLET | Freq: Every day | ORAL | 3 refills | Status: AC

## 2024-02-20 MED ORDER — ROSUVASTATIN CALCIUM 5 MG PO TABS: 5.0000 mg | ORAL_TABLET | Freq: Every day | ORAL | 3 refills | Status: AC

## 2024-02-20 MED ORDER — CYCLOBENZAPRINE HCL 10 MG PO TABS: 5.0000 mg | ORAL_TABLET | Freq: Three times a day (TID) | ORAL | 3 refills | Status: AC | PRN

## 2024-02-20 MED ORDER — CARVEDILOL 3.125 MG PO TABS: 3.1250 mg | ORAL_TABLET | Freq: Two times a day (BID) | ORAL | 3 refills | Status: DC

## 2024-02-20 NOTE — Assessment & Plan Note (Signed)
 Hypertension controlled with current regimen. Losartan /hydrochlorothiazide  causes urinary urgency and sun sensitivity. Metoprolol  suboptimal for blood pressure. Chose carvedilol  for dual benefit on heart rate and blood pressure. - Discontinue hydrochlorothiazide  due to urinary urgency and sun sensitivity. - Continue losartan  100 mg daily. - Switch metoprolol  to carvedilol  3.125 mg twice daily.

## 2024-02-20 NOTE — Assessment & Plan Note (Signed)
 On Crestor  for cholesterol management. Medication well tolerated. - Continue Crestor  5mg  daily.  - Checking lipids. - Recommend increasing exercise and following a low fat diet.

## 2024-02-20 NOTE — Assessment & Plan Note (Signed)
 Monitoring vitamin D  levels. - Order vitamin D  level test.

## 2024-02-20 NOTE — Assessment & Plan Note (Signed)
 Monitoring thyroid  function with TSH levels. - Order TSH test to monitor thyroid  function.

## 2024-02-20 NOTE — Assessment & Plan Note (Signed)
 Low back pain managed with non-pharmacological methods and Tylenol . Meloxicam  caused gastrointestinal side effects. - Discontinue meloxicam  due to gastrointestinal side effects. - Continue non-pharmacological management with ice, stretches, and Tylenol . - Prescribe Flexeril  for acute exacerbations of back pain.

## 2024-02-20 NOTE — Assessment & Plan Note (Signed)
 Taking Omeprazole 20mg  daily with good control of symptoms. Previous attempts to stop the medication resulted in recurrence. - Continue Omeprazole as prescribed.  - Avoid known food triggers.

## 2024-02-20 NOTE — Assessment & Plan Note (Signed)
 History of renal insufficiency but renal function has been normal for the past few years.  - Rechecking CMP today.

## 2024-02-20 NOTE — Patient Instructions (Signed)
Pneumococcal Conjugate Vaccine (PCV20) Injection What is this medication? PNEUMOCOCCAL CONJUGATE VACCINE (NEU mo KOK al kon ju gate vak SEEN) reduces the risk of pneumococcal disease, such as pneumonia. It does not treat pneumococcal disease. It is still possible to get pneumococcal disease after receiving this vaccine, but the symptoms may be less severe or not last as long. It works by helping your immune system learn how to fight off a future infection. This medicine may be used for other purposes; ask your health care provider or pharmacist if you have questions. COMMON BRAND NAME(S): Prevnar 20 What should I tell my care team before I take this medication? They need to know if you have any of these conditions: Bleeding disorder Fever Immune system problems An unusual or allergic reaction to pneumococcal vaccine, diphtheria toxoid, other vaccines, other medications, foods, dyes, or preservatives Pregnant or trying to get pregnant Breastfeeding How should I use this medication? This vaccine is injected into a muscle. It is given by your care team. A copy of Vaccine Information Statements will be given before each vaccination. Be sure to read this information carefully each time. This sheet may change often. Talk to your care team about the use of this medication in children. While it may be given to children as young as 6 weeks for selected conditions, precautions do apply. Overdosage: If you think you have taken too much of this medicine contact a poison control center or emergency room at once. NOTE: This medicine is only for you. Do not share this medicine with others. What if I miss a dose? This does not apply. This medication is not for regular use. What may interact with this medication? Medications for cancer chemotherapy Medications that suppress your immune function Steroid medications, such as prednisone or cortisone This list may not describe all possible interactions. Give  your health care provider a list of all the medicines, herbs, non-prescription drugs, or dietary supplements you use. Also tell them if you smoke, drink alcohol, or use illegal drugs. Some items may interact with your medicine. What should I watch for while using this medication? Visit your care team regularly. Report any side effects to your care team right away. This vaccine, like all vaccines, may not fully protect everyone. What side effects may I notice from receiving this medication? Side effects that you should report to your care team as soon as possible: Allergic reactions--skin rash, itching, hives, swelling of the face, lips, tongue, or throat Side effects that usually do not require medical attention (report these to your care team if they continue or are bothersome): Fatigue Fever Headache Joint pain Muscle pain Pain, redness, or irritation at injection site This list may not describe all possible side effects. Call your doctor for medical advice about side effects. You may report side effects to FDA at 1-800-FDA-1088. Where should I keep my medication? This vaccine is only given by your care team. It will not be stored at home. NOTE: This sheet is a summary. It may not cover all possible information. If you have questions about this medicine, talk to your doctor, pharmacist, or health care provider.  2024 [employer]vier/Gold Standard (2021-11-01 00:00:00)

## 2024-02-20 NOTE — Progress Notes (Signed)
 Established patient visit   History of Present Illness   Discussed the use of AI scribe software for clinical note transcription with the patient, who gave verbal consent to proceed.  History of Present Illness   Desiree Valencia is a 58 year old female who presents for a follow-up visit due to urinary urgency and medication side effects.  Hypertension management - Currently taking Losartan -hydrochlorothiazide  100-25mg  and Toprol -XL 25mg  - Monitoring BP at home with readings at goal - Desires adjustment of antihypertensive regimen due to increased urination with urgency affecting her daily activities  Weight loss - Lost approximately three pounds over the last six months - Doing short walks for exercise - Making dietary changes to cut back on sweets and avoid red meat  Thyroid  nodules and laboratory evaluation - Multiple thyroid  nodules present - Due for recheck of TSH  Vitamin d  deficiency - Known vitamin D  deficiency currently treated with OTC supplementation  Low back pain - Low back pain managed with ice, stretching, Tylenol , and occasional Flexeril  at night - Discontinued meloxicam  due to stomach pain and heartburn  Elevated LDL and bilateral carotid artery plaques - Compliant with rosuvastatin  5mg  daily - No concerning symptoms     Physical Exam   Physical Exam Vitals reviewed.  Constitutional:      General: She is not in acute distress.    Appearance: Normal appearance.  HENT:     Head: Normocephalic and atraumatic.  Cardiovascular:     Rate and Rhythm: Normal rate and regular rhythm.     Pulses: Normal pulses.     Heart sounds: Normal heart sounds. No murmur heard.    No friction rub. No gallop.  Pulmonary:     Effort: Pulmonary effort is normal. No respiratory distress.     Breath sounds: Normal breath sounds. No wheezing.  Skin:    General: Skin is warm and dry.  Neurological:     Mental Status: She is alert and oriented to person,  place, and time.  Psychiatric:        Mood and Affect: Mood normal.        Behavior: Behavior normal.        Thought Content: Thought content normal.        Judgment: Judgment normal.    Assessment & Plan   Problem List Items Addressed This Visit       Cardiovascular and Mediastinum   Benign essential hypertension - Primary   Hypertension controlled with current regimen. Losartan /hydrochlorothiazide  causes urinary urgency and sun sensitivity. Metoprolol  suboptimal for blood pressure. Chose carvedilol  for dual benefit on heart rate and blood pressure. - Discontinue hydrochlorothiazide  due to urinary urgency and sun sensitivity. - Continue losartan  100 mg daily. - Switch metoprolol  to carvedilol  3.125 mg twice daily.      Relevant Medications   losartan  (COZAAR ) 100 MG tablet   carvedilol  (COREG ) 3.125 MG tablet   rosuvastatin  (CRESTOR ) 5 MG tablet   Other Relevant Orders   CBC   CMP14+EGFR   Lipid panel   Carotid artery plaque, bilateral   On Crestor  for cholesterol management. Medication well tolerated. - Continue Crestor  5mg  daily.  - Checking lipids. - Recommend increasing exercise and following a low fat diet.       Relevant Medications   losartan  (COZAAR ) 100 MG tablet   carvedilol  (COREG ) 3.125 MG tablet   rosuvastatin  (CRESTOR ) 5 MG tablet   Other Relevant Orders   Lipid panel  Digestive   Gastroesophageal reflux disease   Taking Omeprazole 20mg  daily with good control of symptoms. Previous attempts to stop the medication resulted in recurrence. - Continue Omeprazole as prescribed.  - Avoid known food triggers.        Endocrine   Multiple thyroid  nodules   Monitoring thyroid  function with TSH levels. - Order TSH test to monitor thyroid  function.      Relevant Medications   carvedilol  (COREG ) 3.125 MG tablet   Other Relevant Orders   TSH     Nervous and Auditory   Lumbar radiculopathy   Low back pain managed with non-pharmacological methods and  Tylenol . Meloxicam  caused gastrointestinal side effects. - Discontinue meloxicam  due to gastrointestinal side effects. - Continue non-pharmacological management with ice, stretches, and Tylenol . - Prescribe Flexeril  for acute exacerbations of back pain.      Relevant Medications   cyclobenzaprine  (FLEXERIL ) 10 MG tablet     Genitourinary   Renal insufficiency   History of renal insufficiency but renal function has been normal for the past few years.  - Rechecking CMP today.       Relevant Orders   CMP14+EGFR     Other   Elevated LDL cholesterol level   On Crestor  for cholesterol management. Medication well tolerated. - Continue Crestor  5mg  daily.  - Checking lipids. - Recommend increasing exercise and following a low fat diet.       Relevant Orders   Lipid panel   Vitamin D  deficiency   Monitoring vitamin D  levels. - Order vitamin D  level test.       Relevant Orders   VITAMIN D  25 Hydroxy (Vit-D Deficiency, Fractures)   Other Visit Diagnoses       Need for influenza vaccination       Relevant Orders   Flu vaccine trivalent PF, 6mos and older(Flulaval,Afluria,Fluarix,Fluzone) (Completed)         Follow up   Return in about 2 weeks (around 03/05/2024) for nurse visit for BP check. __________________________________ Zada FREDRIK Palin, DNP, APRN, FNP-BC Primary Care and Sports Medicine [facility] [place]

## 2024-02-21 ENCOUNTER — Ambulatory Visit: Payer: Self-pay | Admitting: Medical-Surgical

## 2024-02-21 LAB — LIPID PANEL
Chol/HDL Ratio: 3.2 ratio (ref 0.0–4.4)
Cholesterol, Total: 140 mg/dL (ref 100–199)
HDL: 44 mg/dL (ref >39)
LDL Chol Calc (NIH): 75 mg/dL (ref 0–99)
Triglycerides: 115 mg/dL (ref 0–149)
VLDL Cholesterol Cal: 21 mg/dL (ref 5–40)

## 2024-02-21 LAB — CMP14+EGFR
ALT: 25 IU/L (ref 0–32)
AST: 24 IU/L (ref 0–40)
Albumin: 4.5 g/dL (ref 3.8–4.9)
Alkaline Phosphatase: 60 IU/L (ref 49–135)
BUN/Creatinine Ratio: 17 (ref 9–23)
BUN: 16 mg/dL (ref 6–24)
Bilirubin Total: 0.7 mg/dL (ref 0.0–1.2)
CO2: 24 mmol/L (ref 20–29)
Calcium: 10 mg/dL (ref 8.7–10.2)
Chloride: 101 mmol/L (ref 96–106)
Creatinine, Ser: 0.92 mg/dL (ref 0.57–1.00)
Globulin, Total: 2.4 g/dL (ref 1.5–4.5)
Glucose: 103 mg/dL — ABNORMAL HIGH (ref 70–99)
Potassium: 4.1 mmol/L (ref 3.5–5.2)
Sodium: 139 mmol/L (ref 134–144)
Total Protein: 6.9 g/dL (ref 6.0–8.5)
eGFR: 72 mL/min/1.73 (ref >59)

## 2024-02-21 LAB — CBC
Hematocrit: 47.3 % — ABNORMAL HIGH (ref 34.0–46.6)
Hemoglobin: 15.3 g/dL (ref 11.1–15.9)
MCH: 31.5 pg (ref 26.6–33.0)
MCHC: 32.3 g/dL (ref 31.5–35.7)
MCV: 97 fL (ref 79–97)
Platelets: 266 x10E3/uL (ref 150–450)
RBC: 4.86 x10E6/uL (ref 3.77–5.28)
RDW: 12.4 % (ref 11.7–15.4)
WBC: 6 x10E3/uL (ref 3.4–10.8)

## 2024-02-21 LAB — VITAMIN D 25 HYDROXY (VIT D DEFICIENCY, FRACTURES): Vit D, 25-Hydroxy: 82.2 ng/mL (ref 30.0–100.0)

## 2024-02-21 LAB — TSH: TSH: 0.654 u[IU]/mL (ref 0.450–4.500)

## 2024-03-04 NOTE — Progress Notes (Unsigned)
 Pt here today for BP recheck. Last OV BP was 123/81. Denies trouble sleeping, palpitations or medication problems. First BP reading was 141/76. Patient sat for 10 mins and I then rechecked. Second reading was 136/70 reported to Zada Palin, NP who would like to keep the same regiment and and follow back up in 3 months.

## 2024-03-05 ENCOUNTER — Ambulatory Visit (INDEPENDENT_AMBULATORY_CARE_PROVIDER_SITE_OTHER)

## 2024-03-05 VITALS — BP 136/70 | HR 69 | Resp 17 | Ht 65.0 in | Wt 242.0 lb

## 2024-03-05 DIAGNOSIS — I10 Essential (primary) hypertension: Secondary | ICD-10-CM | POA: Diagnosis not present

## 2024-05-14 ENCOUNTER — Other Ambulatory Visit: Payer: Self-pay | Admitting: Medical-Surgical

## 2024-05-14 DIAGNOSIS — Z1231 Encounter for screening mammogram for malignant neoplasm of breast: Secondary | ICD-10-CM

## 2024-05-19 ENCOUNTER — Encounter: Payer: Self-pay | Admitting: Medical-Surgical

## 2024-05-22 ENCOUNTER — Ambulatory Visit (INDEPENDENT_AMBULATORY_CARE_PROVIDER_SITE_OTHER)

## 2024-05-22 DIAGNOSIS — Z1231 Encounter for screening mammogram for malignant neoplasm of breast: Secondary | ICD-10-CM

## 2024-05-29 ENCOUNTER — Ambulatory Visit: Payer: Self-pay | Admitting: Medical-Surgical

## 2024-06-05 ENCOUNTER — Encounter: Payer: Self-pay | Admitting: Medical-Surgical

## 2024-06-05 ENCOUNTER — Ambulatory Visit: Admitting: Medical-Surgical

## 2024-06-05 VITALS — BP 124/80 | HR 71 | Temp 99.7°F | Resp 20 | Ht 66.0 in | Wt 243.1 lb

## 2024-06-05 DIAGNOSIS — I10 Essential (primary) hypertension: Secondary | ICD-10-CM

## 2024-06-05 DIAGNOSIS — R509 Fever, unspecified: Secondary | ICD-10-CM

## 2024-06-05 DIAGNOSIS — D6851 Activated protein C resistance: Secondary | ICD-10-CM | POA: Diagnosis not present

## 2024-06-05 LAB — POC COVID19/FLU A&B COMBO
Covid Antigen, POC: NEGATIVE
Influenza A Antigen, POC: NEGATIVE
Influenza B Antigen, POC: NEGATIVE

## 2024-06-05 MED ORDER — CARVEDILOL 6.25 MG PO TABS: 6.2500 mg | ORAL_TABLET | Freq: Two times a day (BID) | ORAL | 0 refills | Status: AC

## 2024-06-05 NOTE — Progress Notes (Signed)
" ° °       Established patient visit   History of Present Illness   Discussed the use of AI scribe software for clinical note transcription with the patient, who gave verbal consent to proceed.  History of Present Illness   Desiree Valencia is a 59 year old female with hypertension who presents for follow-up on her blood pressure management.  Hypertension management - Home blood pressure readings usually in the high 120s/70s mmHg range - Concerned that blood pressure is not consistently below 120/80 mmHg - Occasionally takes antihypertensive medications late - Takes losartan  100 mg daily and carvedilol  3.125 mg twice a day without side effects - No chest pain, shortness of breath, dizziness, lightheadedness, or leg swelling  Upper respiratory symptoms - Tired and sluggish for the past few days - Mild headache and sinus congestion - Not using any medications for these symptoms due to uncertainty about safety with antihypertensive medications  Thromboembolic risk - Factor V Leiden mutation - Takes baby aspirin daily - No prolonged bleeding or clotting episodes  Lifestyle and stress management - Weight maintained over the holidays with attention to nutrition - Needs more regular exercise - Improved stress management and increased awareness of overthinking or feeling overwhelmed     Physical Exam   Physical Exam Vitals reviewed.  Constitutional:      General: She is not in acute distress.    Appearance: Normal appearance. She is not ill-appearing.  HENT:     Head: Normocephalic and atraumatic.  Cardiovascular:     Rate and Rhythm: Normal rate and regular rhythm.     Pulses: Normal pulses.     Heart sounds: Normal heart sounds. No murmur heard.    No friction rub. No gallop.  Pulmonary:     Effort: Pulmonary effort is normal. No respiratory distress.     Breath sounds: Normal breath sounds. No wheezing.  Skin:    General: Skin is warm and dry.  Neurological:      Mental Status: She is alert and oriented to person, place, and time.  Psychiatric:        Mood and Affect: Mood normal.        Behavior: Behavior normal.        Thought Content: Thought content normal.        Judgment: Judgment normal.    Assessment & Plan   Benign essential hypertension Blood pressure in high 120s/70s at home. Occasional elevation with delayed medication. On losartan  100 mg and carvedilol  3.125 mg twice daily. No side effects. Discussed increasing carvedilol  for better control. - Increased carvedilol  to 6.25 mg twice daily. - Rechecked blood pressure during visit-124/80.  Heterozygous factor V Leiden mutation No bleeding or clotting episodes. Normal platelet levels. Continues baby aspirin. - Continue baby aspirin. - Monitor for signs of bleeding or clotting.  Fever, headache, and fatigue Mild fever, headache, and fatigue. No significant sinus symptoms. Discussed flu and COVID testing. - Tested for flu and COVID with negative results. - Advised over-the-counter cold and flu medications made specifically for patients with high blood pressure if symptoms worsen.  General health maintenance Discussed pneumonia vaccination guidelines, not eligible due to fever. Discussed flu and COVID vaccinations. - Consider pneumonia vaccination after fever resolution. - Discussed flu and COVID vaccinations.     Follow up   Return in about 2 weeks (around 06/19/2024) for nurse visit for BP check. __________________________________ Zada FREDRIK Palin, DNP, APRN, FNP-BC Primary Care and Sports Medicine [facility] [place] "

## 2024-08-19 ENCOUNTER — Ambulatory Visit: Admitting: Medical-Surgical
